# Patient Record
Sex: Male | Born: 1940 | Race: White | Hispanic: No | Marital: Single | Smoking: Never smoker
Health system: Southern US, Community
[De-identification: ages and names within clinical notes are randomized; demographics above are authoritative.]

## PROBLEM LIST (undated history)

## (undated) DIAGNOSIS — Z8489 Family history of other specified conditions: Secondary | ICD-10-CM

## (undated) DIAGNOSIS — E785 Hyperlipidemia, unspecified: Secondary | ICD-10-CM

## (undated) DIAGNOSIS — F419 Anxiety disorder, unspecified: Secondary | ICD-10-CM

## (undated) DIAGNOSIS — T4145XA Adverse effect of unspecified anesthetic, initial encounter: Secondary | ICD-10-CM

## (undated) DIAGNOSIS — G709 Myoneural disorder, unspecified: Secondary | ICD-10-CM

## (undated) DIAGNOSIS — F32A Depression, unspecified: Secondary | ICD-10-CM

## (undated) DIAGNOSIS — H3530 Unspecified macular degeneration: Secondary | ICD-10-CM

## (undated) DIAGNOSIS — Z9889 Other specified postprocedural states: Secondary | ICD-10-CM

## (undated) DIAGNOSIS — M1990 Unspecified osteoarthritis, unspecified site: Secondary | ICD-10-CM

## (undated) DIAGNOSIS — T8859XA Other complications of anesthesia, initial encounter: Secondary | ICD-10-CM

## (undated) DIAGNOSIS — M19011 Primary osteoarthritis, right shoulder: Secondary | ICD-10-CM

## (undated) DIAGNOSIS — R112 Nausea with vomiting, unspecified: Secondary | ICD-10-CM

## (undated) DIAGNOSIS — I10 Essential (primary) hypertension: Secondary | ICD-10-CM

## (undated) DIAGNOSIS — Z9289 Personal history of other medical treatment: Secondary | ICD-10-CM

## (undated) DIAGNOSIS — M19012 Primary osteoarthritis, left shoulder: Secondary | ICD-10-CM

## (undated) DIAGNOSIS — F329 Major depressive disorder, single episode, unspecified: Secondary | ICD-10-CM

## (undated) HISTORY — PX: CHOLECYSTECTOMY: SHX55

## (undated) HISTORY — DX: Hyperlipidemia, unspecified: E78.5

## (undated) HISTORY — PX: JOINT REPLACEMENT: SHX530

## (undated) HISTORY — PX: TONSILLECTOMY: SUR1361

## (undated) HISTORY — PX: SHOULDER ARTHROSCOPY: SHX128

## (undated) HISTORY — PX: KNEE SURGERY: SHX244

---

## 1998-10-31 ENCOUNTER — Ambulatory Visit (HOSPITAL_COMMUNITY): Admission: RE | Admit: 1998-10-31 | Discharge: 1998-10-31 | Payer: Self-pay | Admitting: Family Medicine

## 2000-02-09 ENCOUNTER — Ambulatory Visit (HOSPITAL_COMMUNITY): Admission: RE | Admit: 2000-02-09 | Discharge: 2000-02-09 | Payer: Self-pay | Admitting: Orthopedic Surgery

## 2000-02-09 ENCOUNTER — Encounter: Payer: Self-pay | Admitting: Orthopedic Surgery

## 2000-02-29 ENCOUNTER — Encounter: Payer: Self-pay | Admitting: Orthopedic Surgery

## 2000-03-04 ENCOUNTER — Inpatient Hospital Stay (HOSPITAL_COMMUNITY): Admission: RE | Admit: 2000-03-04 | Discharge: 2000-03-08 | Payer: Self-pay | Admitting: Orthopedic Surgery

## 2004-09-07 IMAGING — CR DG CHEST 2V
2 series · 2 of 2 positions shown · non-contrast
Comparison: none

CLINICAL DATA: Preop herniated disc. 
 CHEST (TWO VIEWS) [DATE] AT [YW] HOURS
 The heart is normal in size.  The aorta is mildly tortuous.  The lungs are under inflated.  Subsegmental atelectasis is present at the left base.  No pneumothoraces or effusions are seen.  Prominent bony density is seen at the costal manubrial junction bilaterally.  Old rib fractures are suspected involving the right fifth and sixth ribs anterolaterally. 
 IMPRESSION
 Subsegmental atelectasis versus scar at the left base.  Otherwise no acute disease.

[view not recorded (1 of 2)]
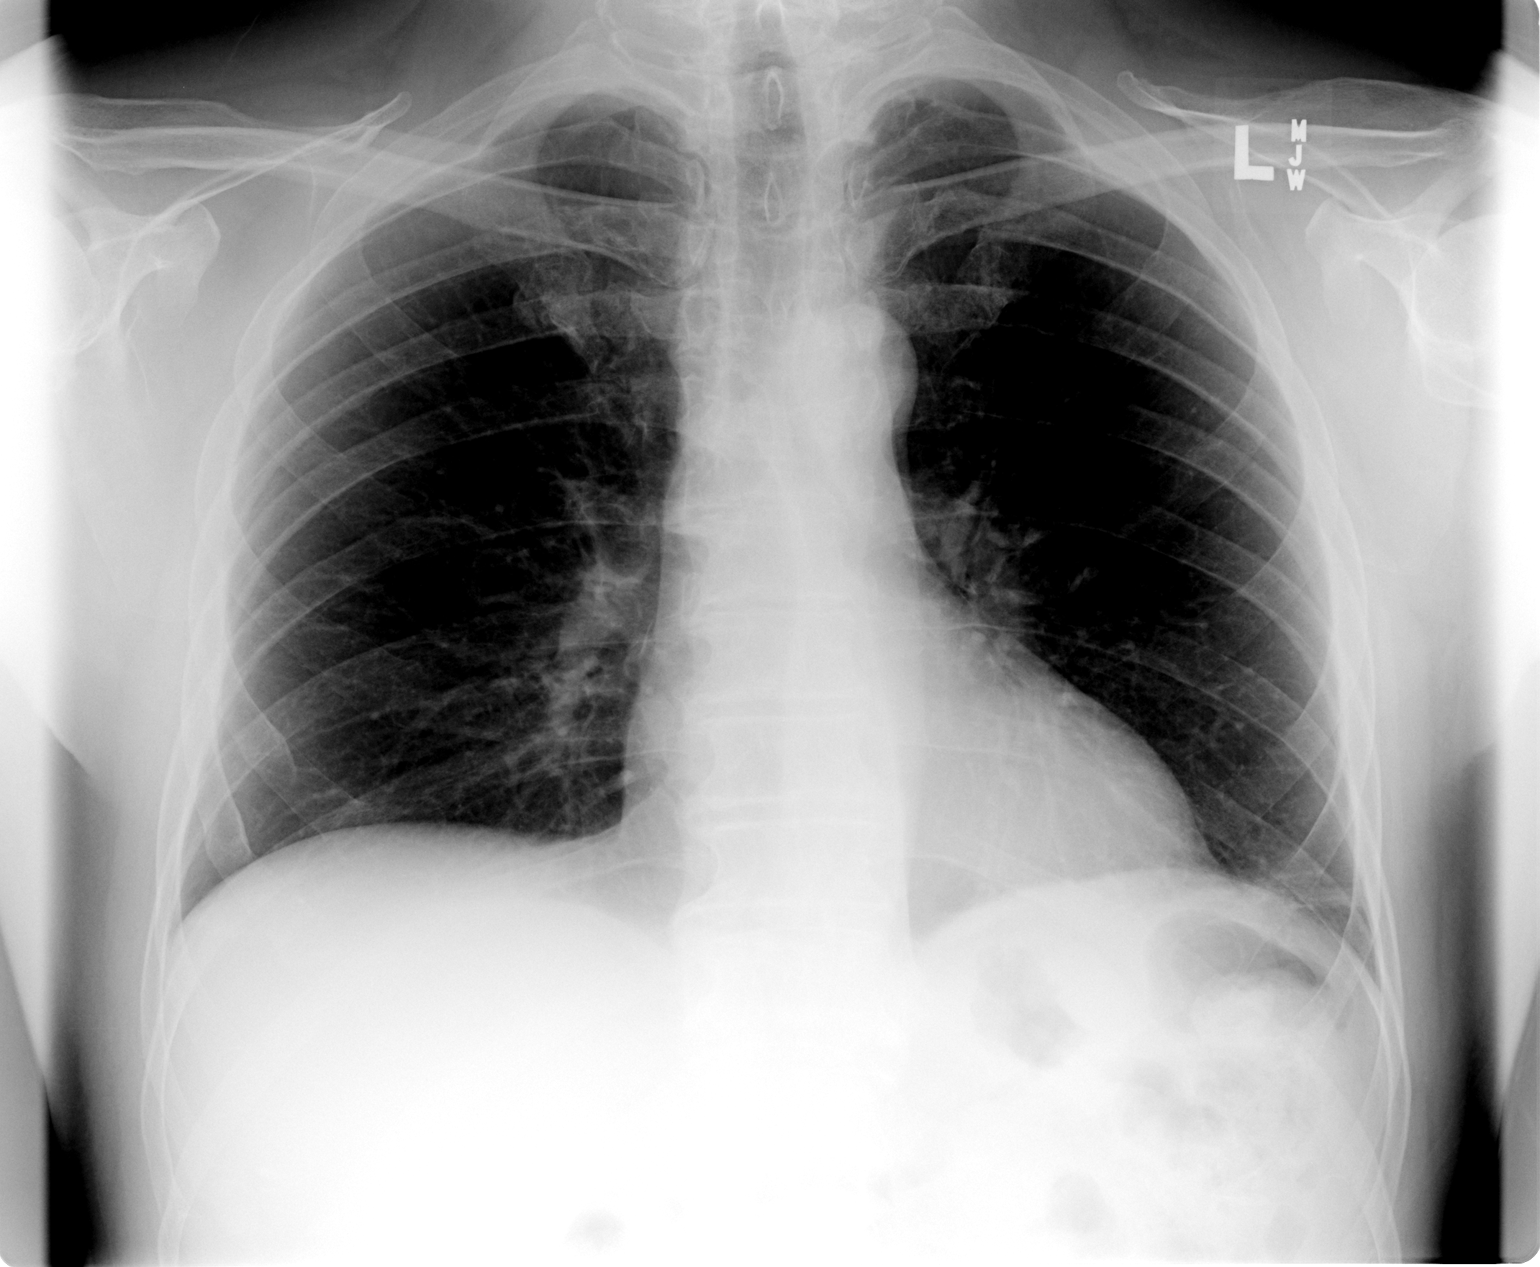

[view not recorded (2 of 2)]
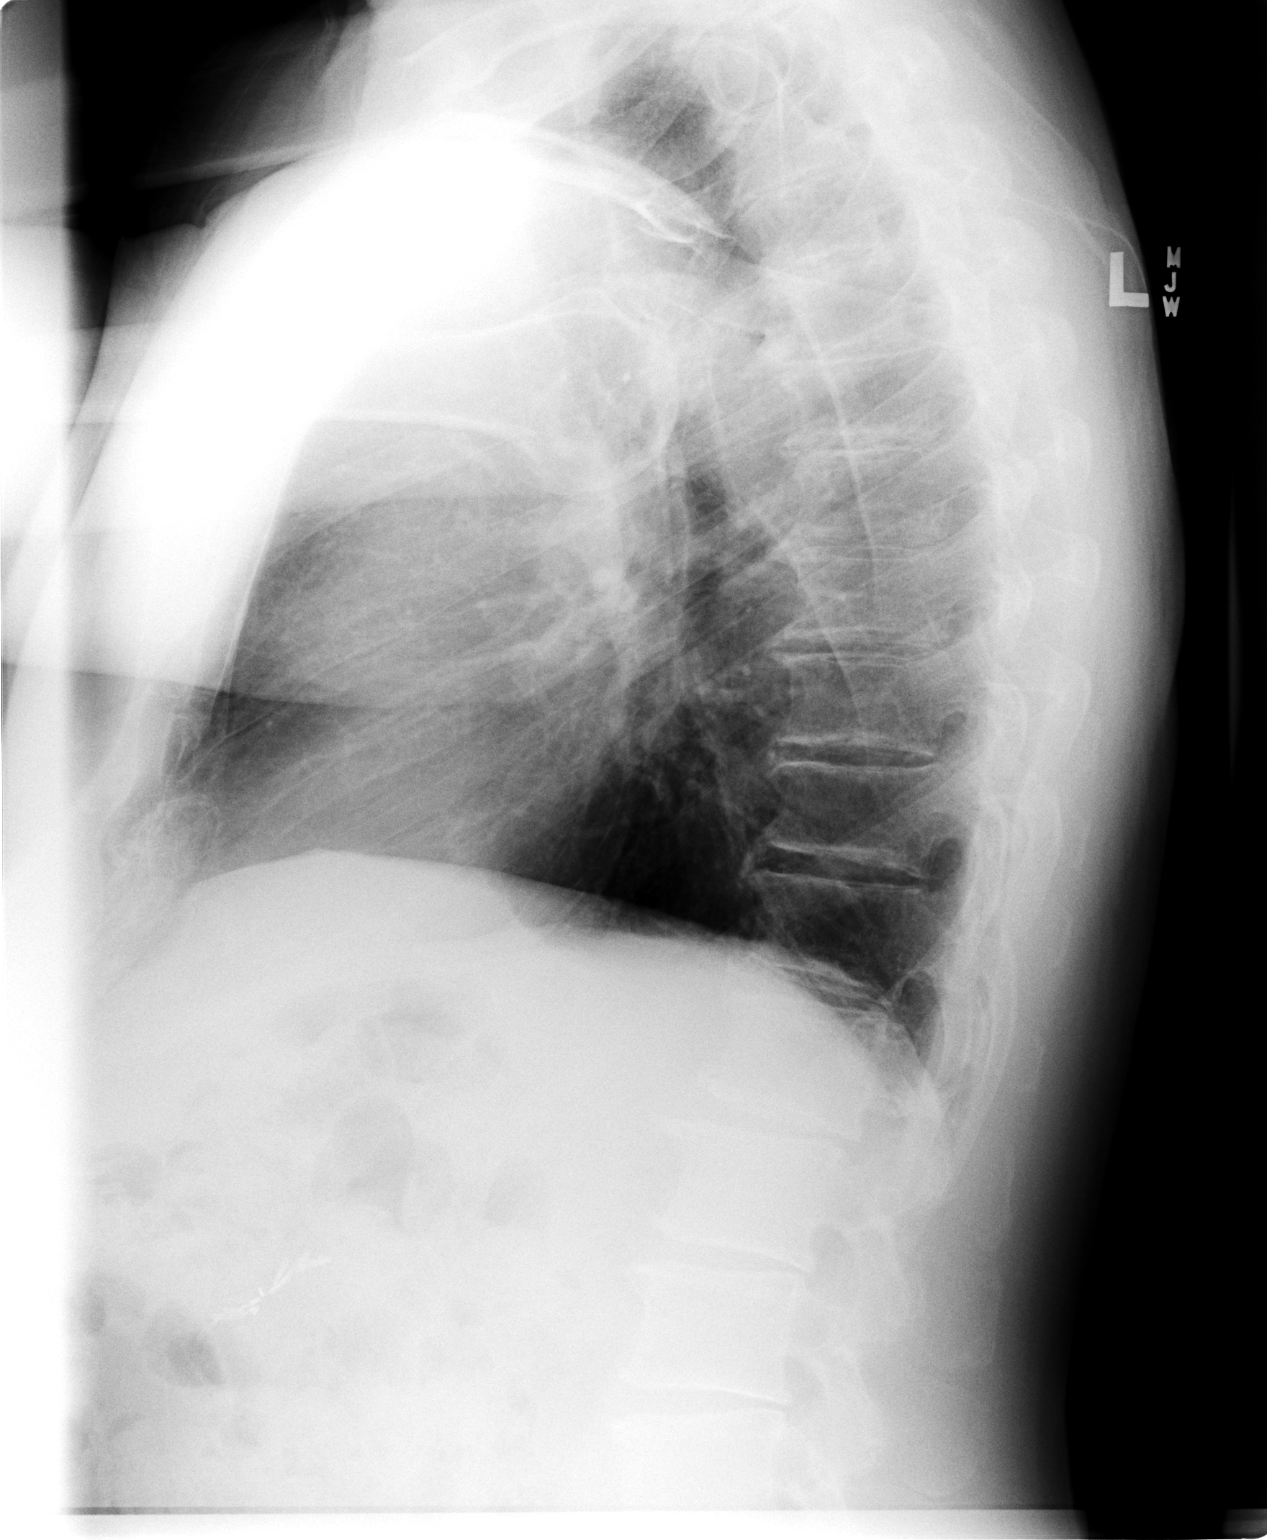

[2 of 2 positions shown; findings below may reference images not displayed]

## 2004-09-12 ENCOUNTER — Inpatient Hospital Stay (HOSPITAL_COMMUNITY): Admission: RE | Admit: 2004-09-12 | Discharge: 2004-09-14 | Payer: Self-pay | Admitting: Neurosurgery

## 2005-08-27 ENCOUNTER — Ambulatory Visit (HOSPITAL_BASED_OUTPATIENT_CLINIC_OR_DEPARTMENT_OTHER): Admission: RE | Admit: 2005-08-27 | Discharge: 2005-08-27 | Payer: Self-pay | Admitting: Orthopedic Surgery

## 2005-08-27 ENCOUNTER — Ambulatory Visit (HOSPITAL_COMMUNITY): Admission: RE | Admit: 2005-08-27 | Discharge: 2005-08-27 | Payer: Self-pay | Admitting: Orthopedic Surgery

## 2006-05-23 ENCOUNTER — Ambulatory Visit: Payer: Self-pay | Admitting: Internal Medicine

## 2006-05-30 ENCOUNTER — Ambulatory Visit: Payer: Self-pay | Admitting: Internal Medicine

## 2006-12-02 HISTORY — PX: CERVICAL FUSION: SHX112

## 2007-05-14 ENCOUNTER — Encounter: Admission: RE | Admit: 2007-05-14 | Discharge: 2007-05-14 | Payer: Self-pay | Admitting: Family Medicine

## 2008-01-05 ENCOUNTER — Inpatient Hospital Stay (HOSPITAL_COMMUNITY): Admission: RE | Admit: 2008-01-05 | Discharge: 2008-01-08 | Payer: Self-pay | Admitting: Orthopedic Surgery

## 2008-01-05 IMAGING — CR DG CHEST 2V
2 series · 2 of 2 positions shown · non-contrast
Comparison: [DATE].

CLINICAL DATA: Preoperative respiratory film ? patient for knee replacement. 
 CHEST - 2 VIEW:

[view not recorded (1 of 2)]
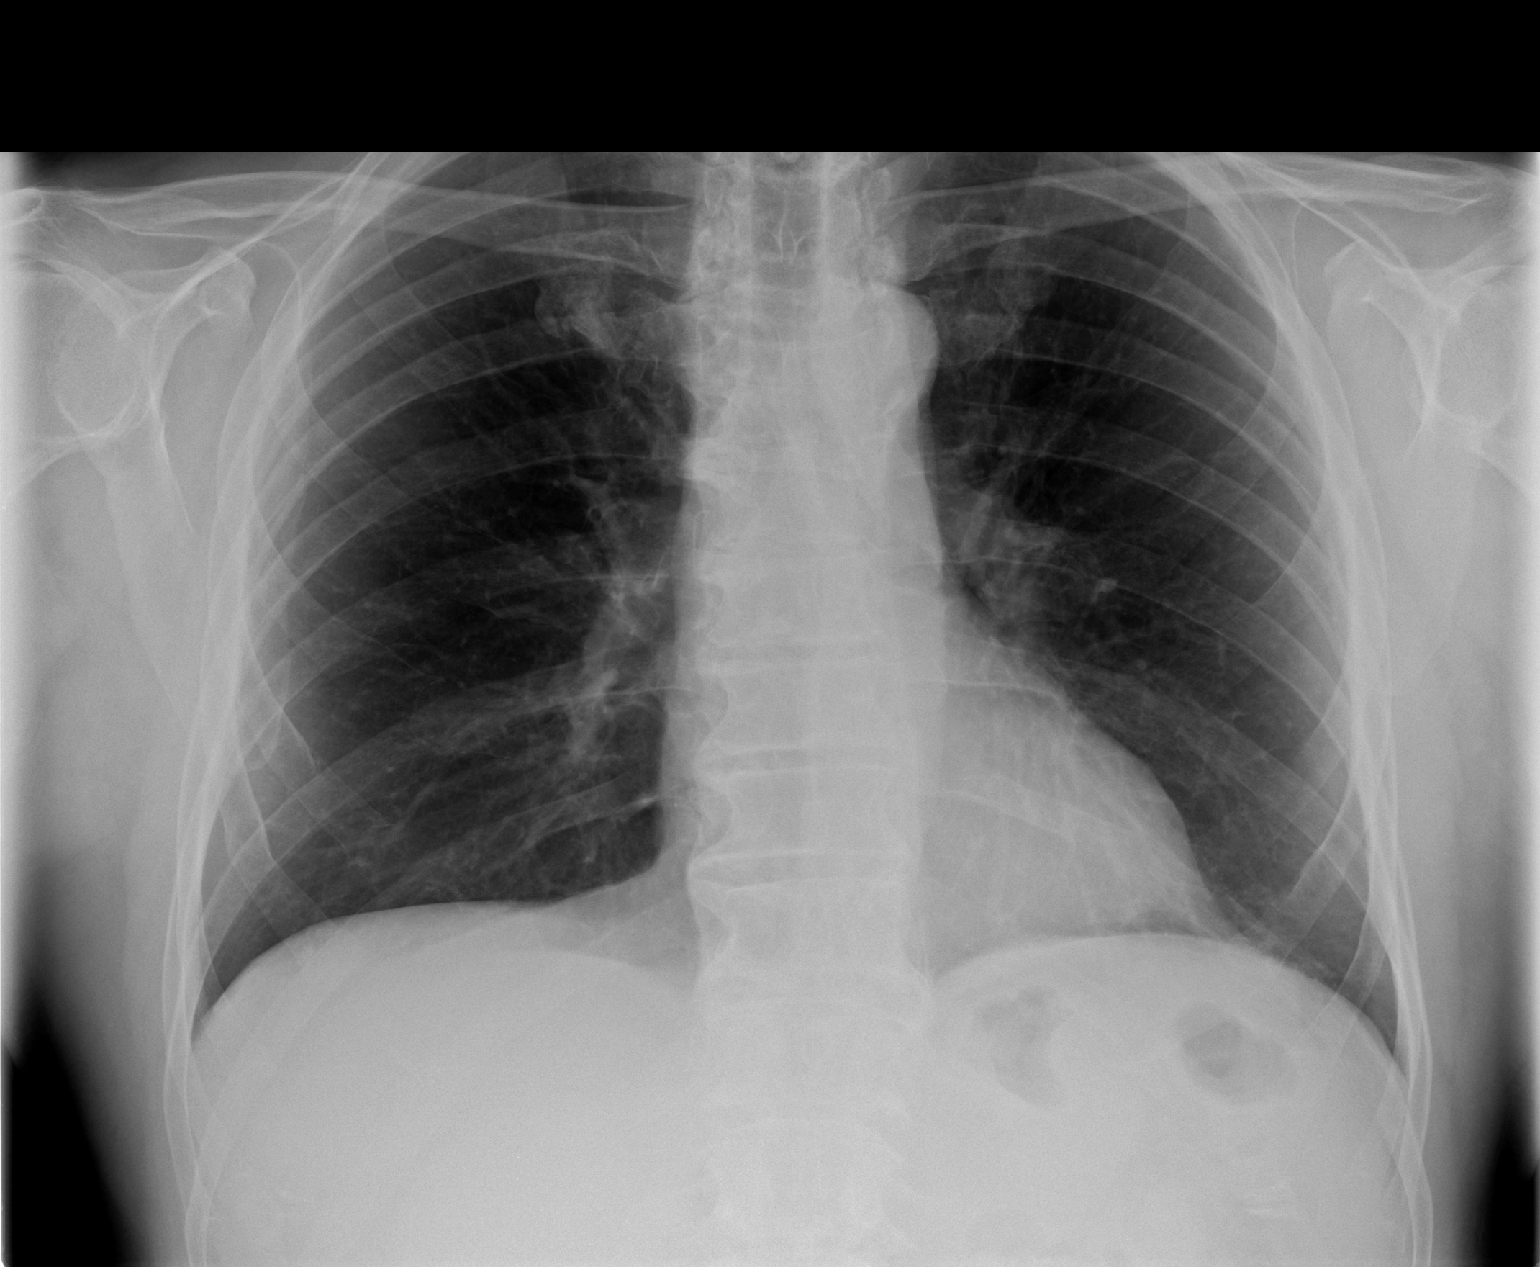

[view not recorded (2 of 2)]
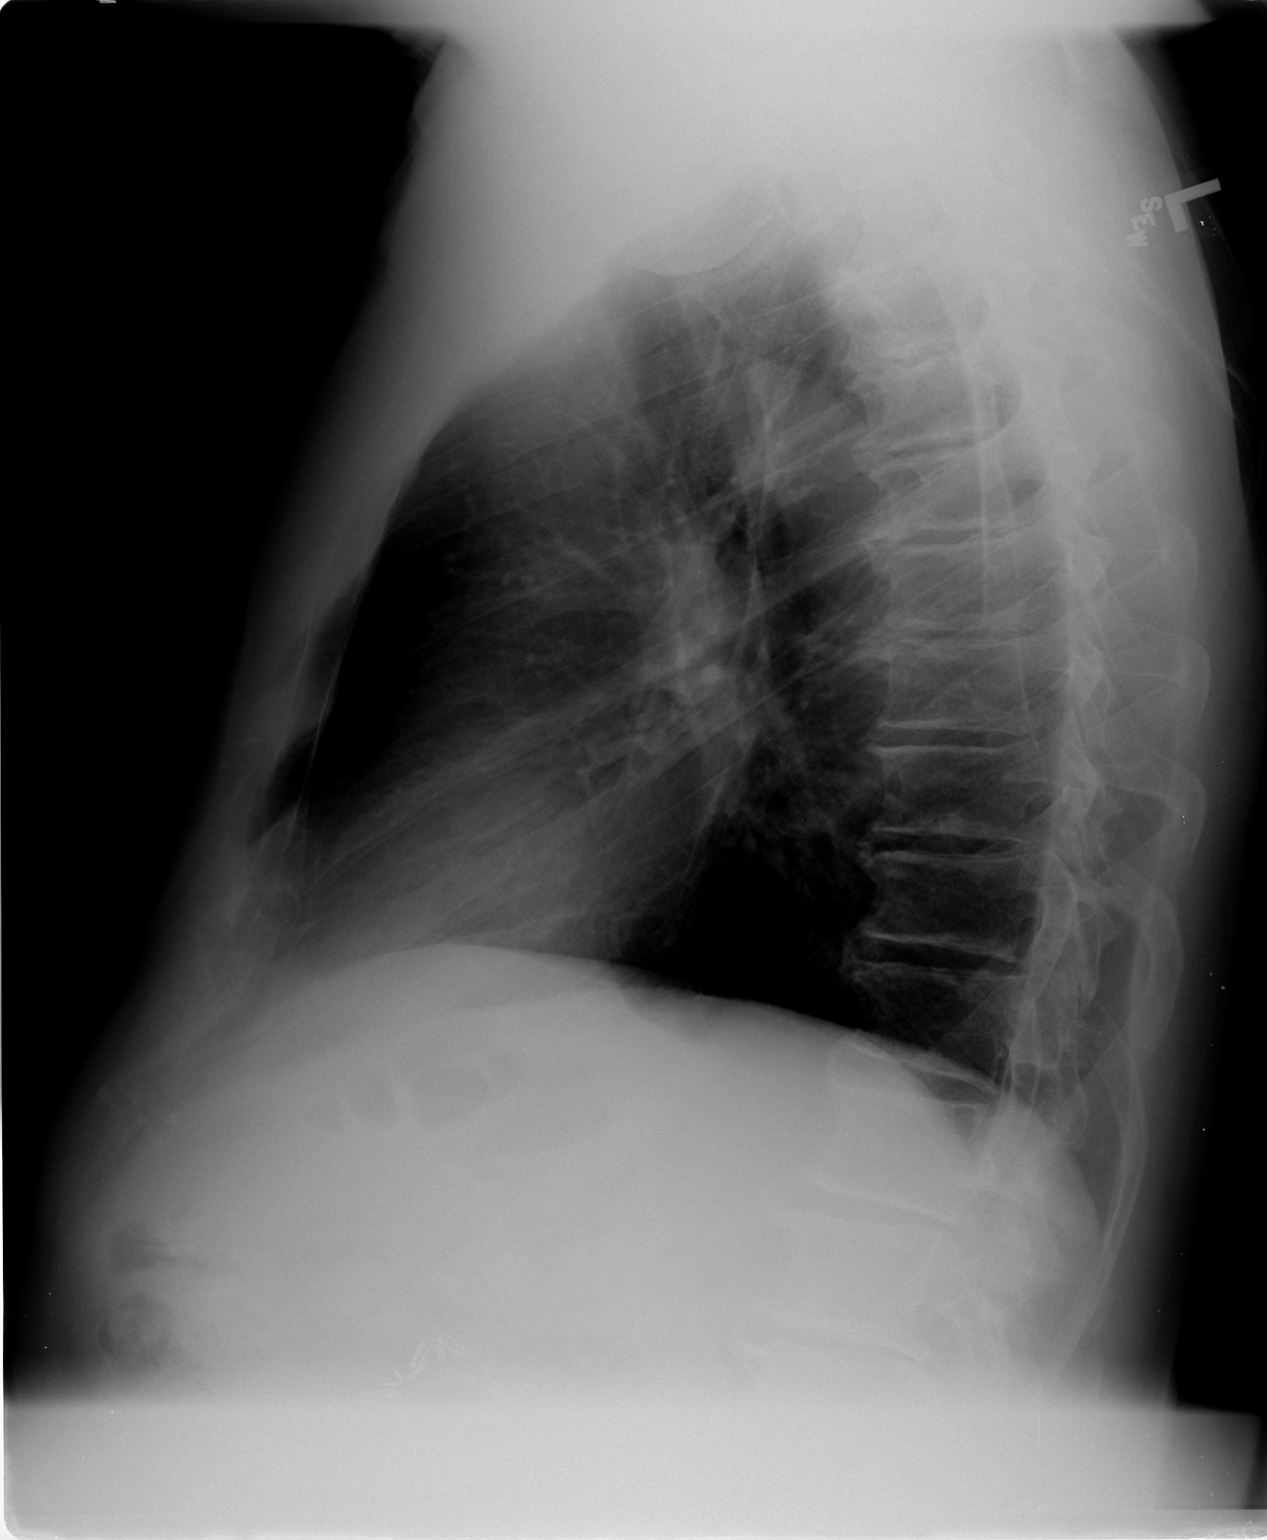

[2 of 2 positions shown; findings below may reference images not displayed]

FINDINGS: Minimal scarring is seen in the left lung base and the lungs are otherwise clear.  The heart size is normal.  No effusion.  Old fractures of the anterior arch of the right 5th and 6th ribs noted.
IMPRESSION: No acute disease.

## 2008-01-06 IMAGING — CR DG CHEST 1V PORT
1 series · 1 of 1 positions shown · non-contrast
Comparison: [DATE]

CLINICAL DATA: Elevated heart rate.
 PORTABLE CHEST ? 1 VIEW:

[AP]
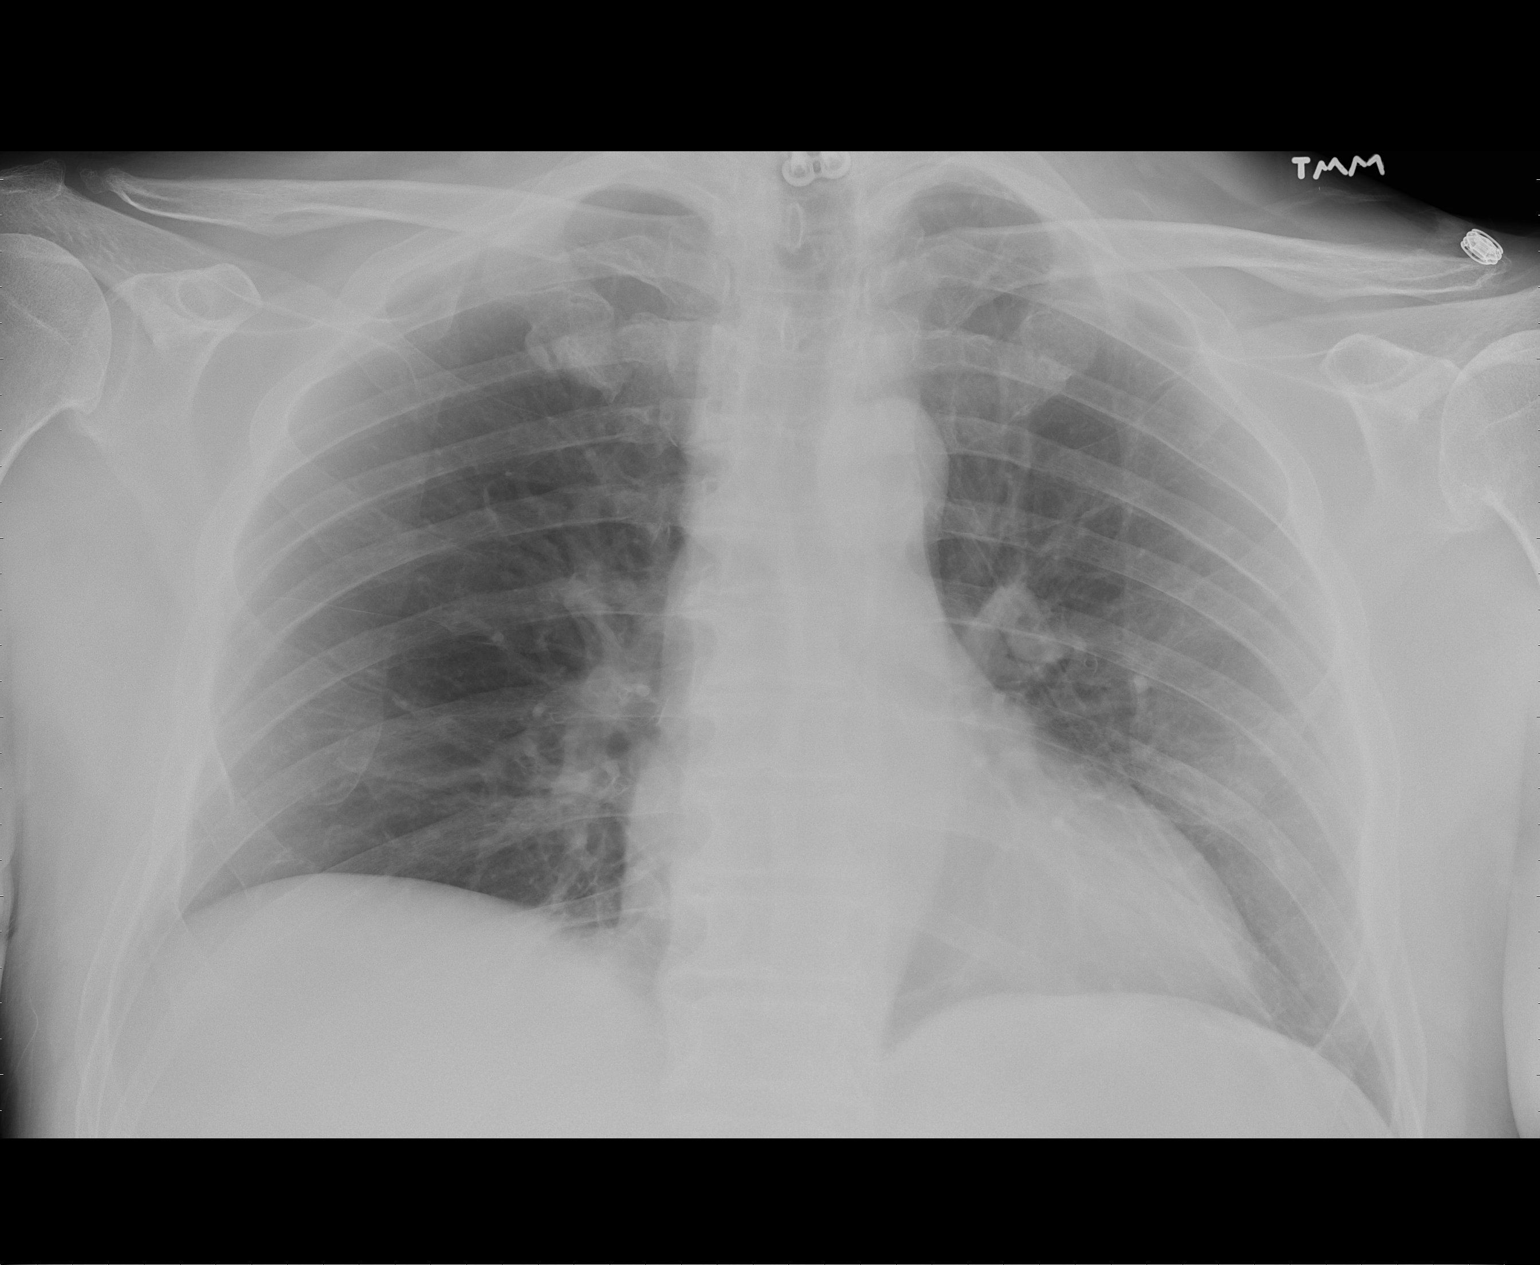

[1 of 1 positions shown; findings below may reference images not displayed]

FINDINGS: Low lung volumes are present.  Accentuated cardiac size.  There are no infiltrates, failure, or pneumothorax.  Previous cervical fusion surgery has been performed.  Degenerative changes are present in the spine.
IMPRESSION: Low lung volumes.  No active disease.

## 2008-01-07 ENCOUNTER — Encounter (INDEPENDENT_AMBULATORY_CARE_PROVIDER_SITE_OTHER): Payer: Self-pay | Admitting: Orthopedic Surgery

## 2008-01-08 IMAGING — CT CT ANGIO CHEST
2 of 5 series · 19 of 36 positions shown · IV contrast (APPLIED)
Comparison: [DATE]

CLINICAL DATA: DVT. Chest tightness. Total knee prosthesis placement.

CT ANGIOGRAPHY OF CHEST - PULMONARY EMBOLISM PROTOCOL
TECHNIQUE: Multidetector CT imaging of the chest was performed according to the
protocol for detection of pulmonary embolism during bolus injection of
intravenous contrast.  Coronal and sagittal plane CT angiographic image
reconstructions were also generated.
Contrast:  80 cc Omnipaque 300

[Series 8: pulm embolism 1.0 b25f thins · axial · 0.69mm/px · z∈[-300,-64]mm · 16 of 264 slices shown]
[im 14/264  lung]
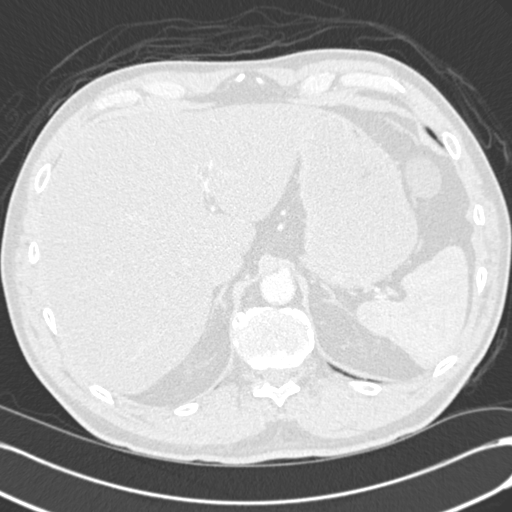
[im 27/264  mediastinal]
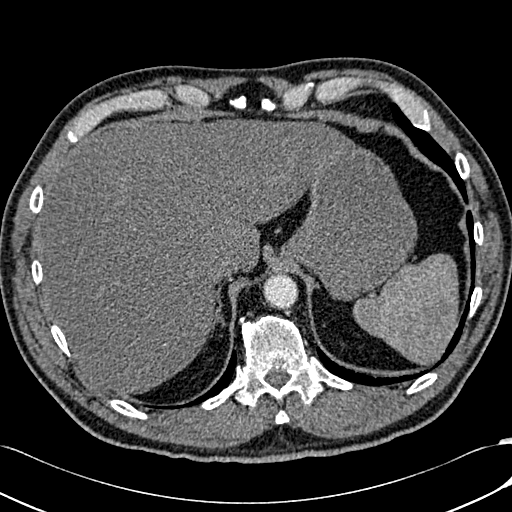
[im 40/264  lung]
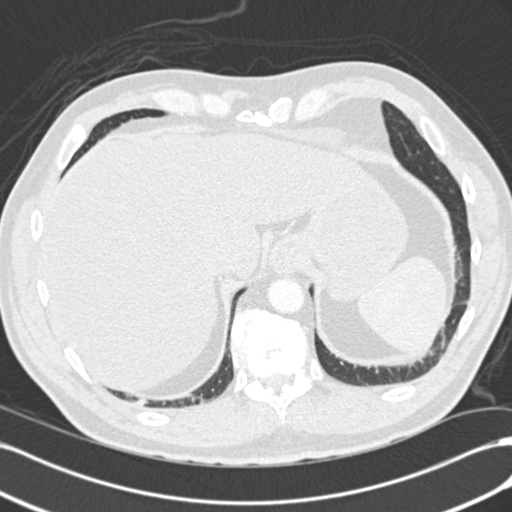
[im 66/264  mediastinal]
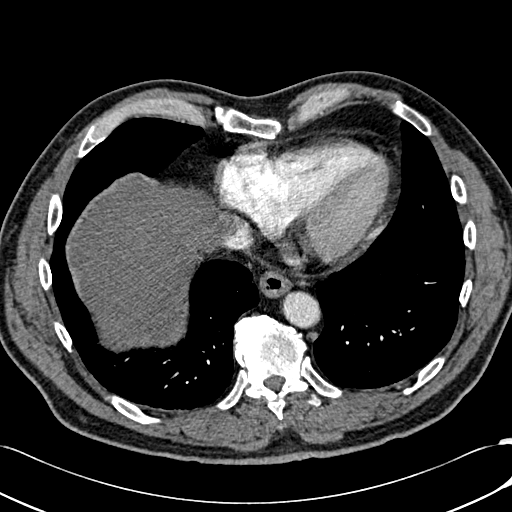
[im 79/264  lung]
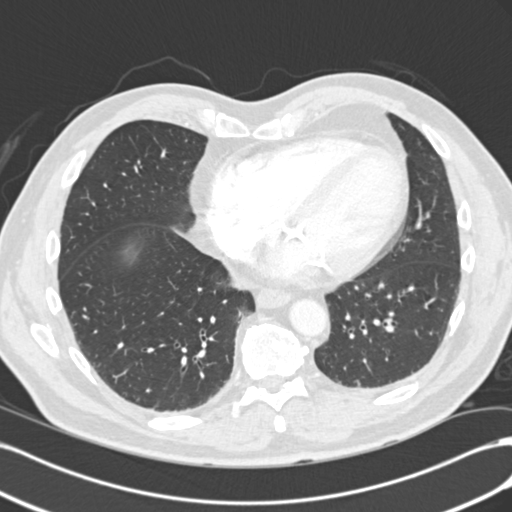
[im 93/264  mediastinal]
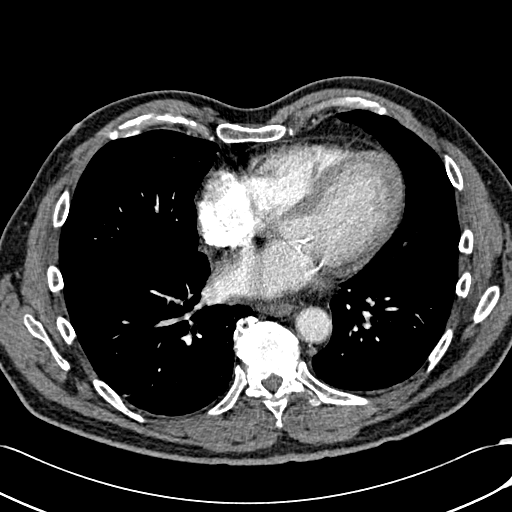
[im 106/264  lung]
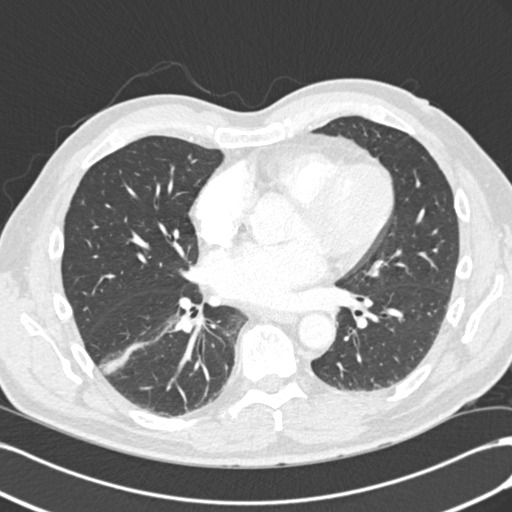
[im 119/264  mediastinal]
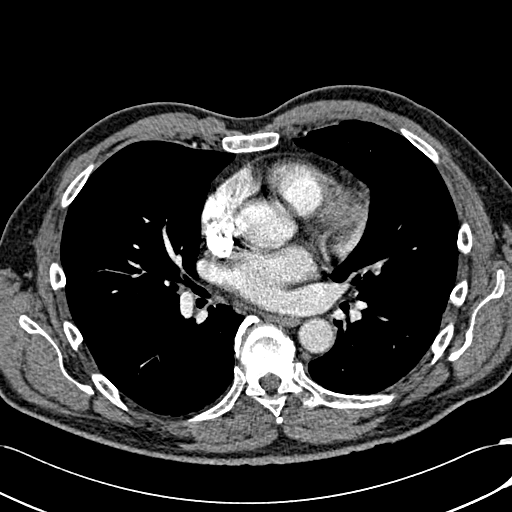
[im 145/264  lung]
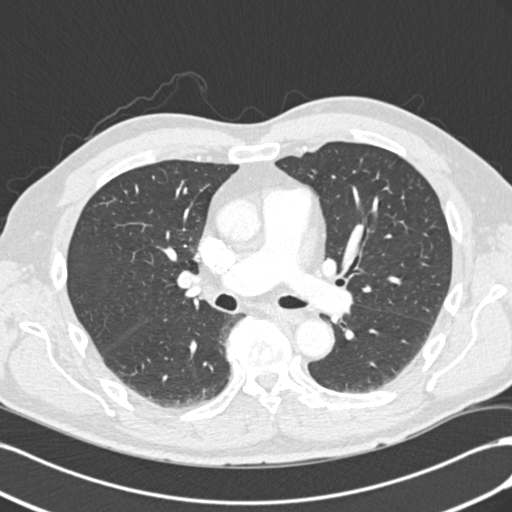
[im 158/264  mediastinal]
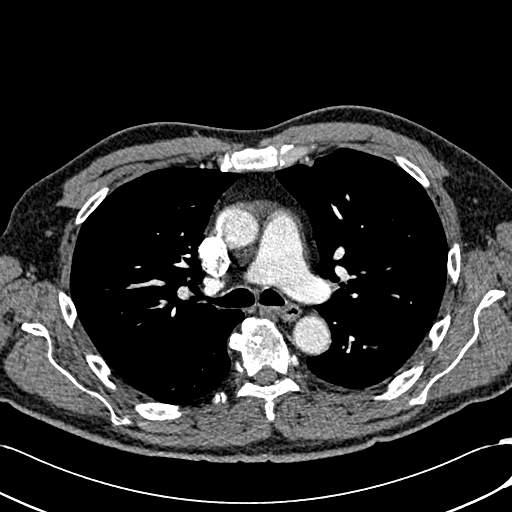
[im 171/264  lung]
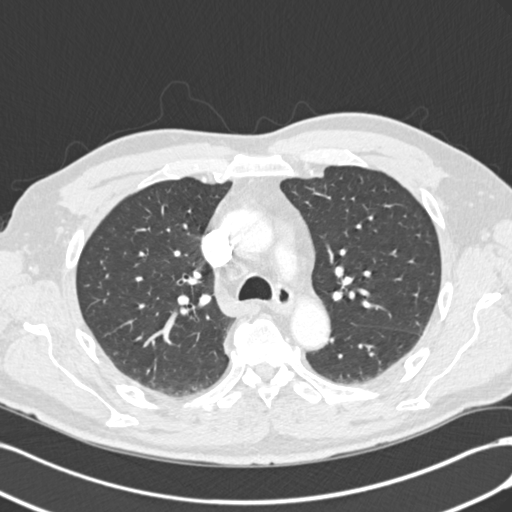
[im 185/264  mediastinal]
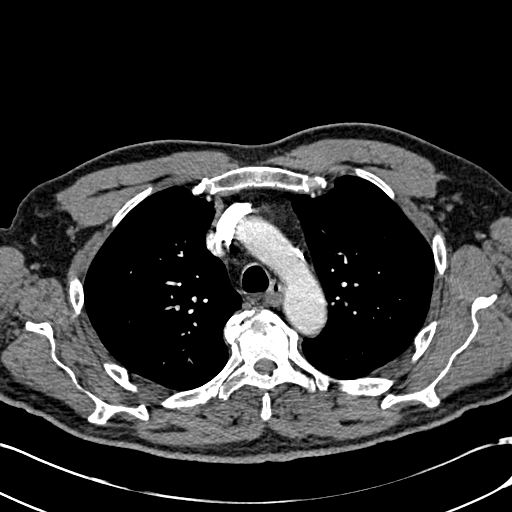
[im 198/264  lung]
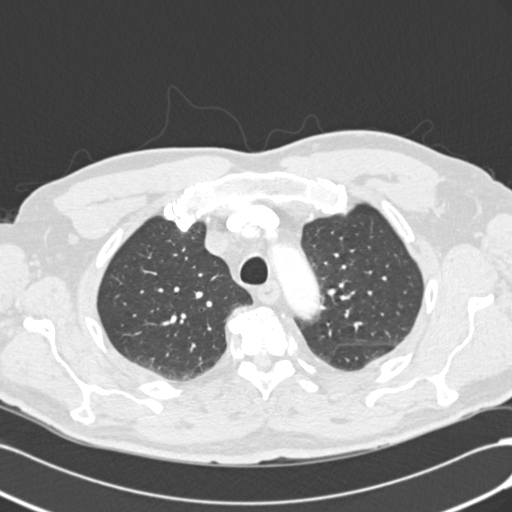
[im 224/264  mediastinal]
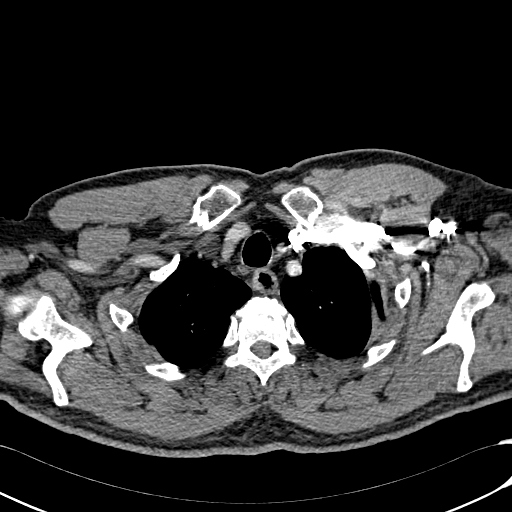
[im 237/264  lung]
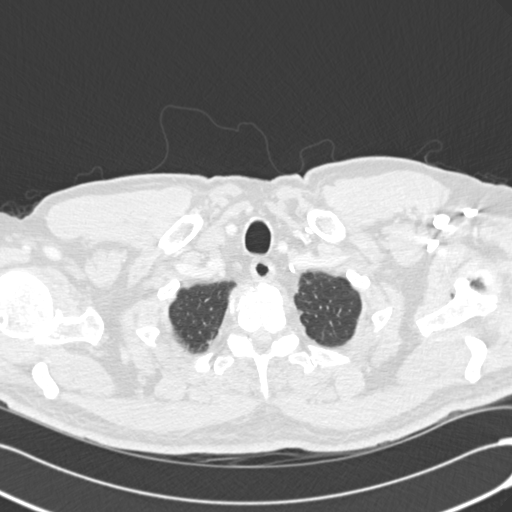
[im 250/264  mediastinal]
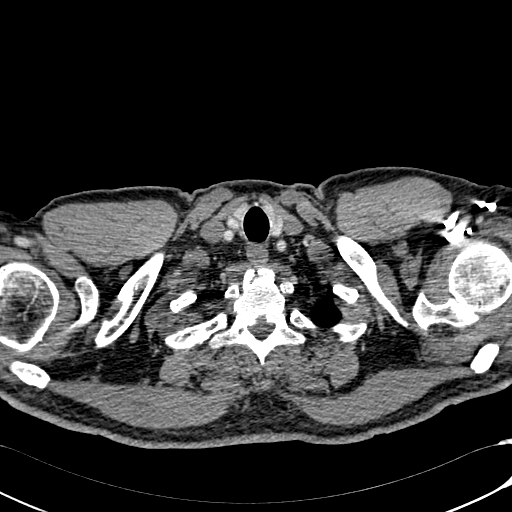

[Series 9: pulm embolism 2.0 spo st · coronal · 0.69mm/px · 3 of 114 slices shown]
[im 23/114  mediastinal]
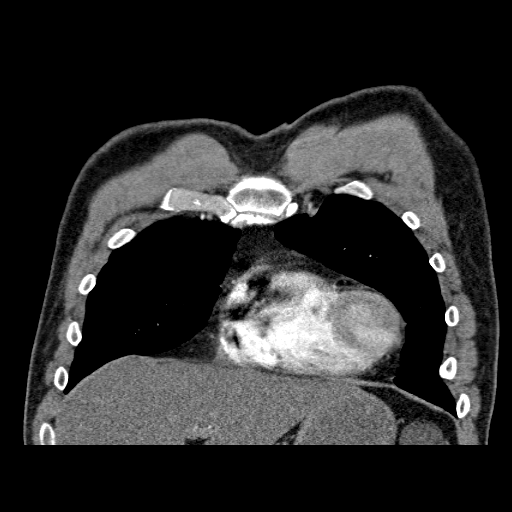
[im 46/114  mediastinal]
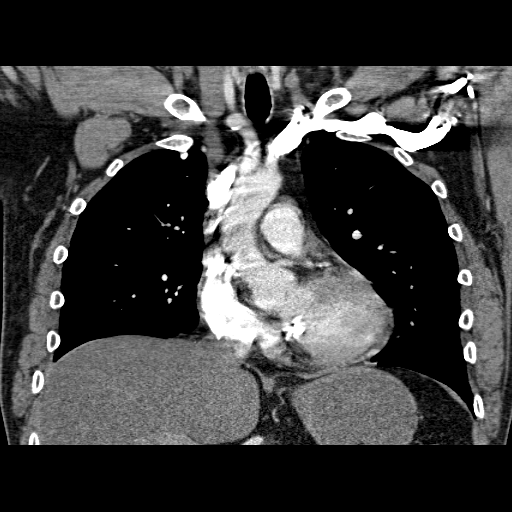
[im 68/114  mediastinal]
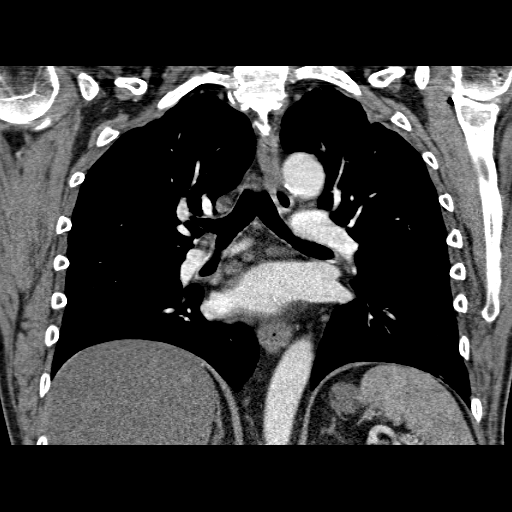

[19 of 36 positions shown; findings below may reference images not displayed]

FINDINGS: No filling defect is identified in the pulmonary arterial tree to
suggest pulmonary embolus. Aortic arch atherosclerotic calcification is present.
No dissection is noted.

No pathologic mediastinal or hilar adenopathy is noted. There is diffuse fatty
infiltration of the liver. Calcification of the mitral valve is noted.

Linear subsegmental atelectasis is present in the right lower lobe. Thoracic
spondylosis is present.

IMPRESSION

1. No embolus is identified.
2. Mitral calcification noted.
3. Diffuse fatty infiltration of the liver.
4. Right lower lobe subsegmental atelectasis.
5. Thoracic spondylosis.

## 2011-04-16 NOTE — Op Note (Signed)
NAME:  Mitchell Harris, Mitchell Harris NO.:  [id number]   MEDICAL RECORD NO.:  [id number]          PATIENT TYPE:  INP   LOCATION:  2855                         FACILITY:  [facility]   PHYSICIAN:  Rodney A. Mortenson, M.D.DATE OF BIRTH:  December 07, 1940   DATE OF PROCEDURE:  01/05/2008  DATE OF DISCHARGE:                               OPERATIVE REPORT   PREOPERATIVE DIAGNOSIS:  End-stage osteoarthritis right knee.   POSTOPERATIVE DIAGNOSIS:  End-stage osteoarthritis right knee.   OPERATION:  Right total knee replacement, computer assist, using DePuy  large right cemented femoral component with an NBT tibial tray size 4,  large cemented metal back patella, and a 10 mm poly insert.   SURGEON:  Lenard Galloway. Chaney Malling, M.D.   ASSISTANT:  Arlys John D. Petrarca, P.A.-C.   ANESTHESIA:  General.   DESCRIPTION OF PROCEDURE:  The patient placed on the operating table in  the supine position and a pneumatic tourniquet wrapped about the right  upper thigh.  The entire right lower extremity was prepped with  DuraPrep, and draped out in the usual manner.  A vidrape was placed over  the operative site.  The leg was wrapped out with an Esmarch, tourniquet  was elevated.  An incision was made starting above the patella and  carried down to the tibial tubercle.  Skin edges were retracted.  Bleeders coagulated.  A long medial parapatellar incision was made, and  the patella was everted.  The bone spurs on either side of the femoral  condyles and tibia were removed with a rongeur.  There was significant  varus deformity, and soft tissue release was done on the medial side,  quite extensively.  This gave excellent access to the joint itself.   Both medial and lateral meniscus were then totally excised.  Cruciate  ligaments were removed.  Excellent access was achieved.  At this point,  Schanz pins were placed in the distal femoral proximal tibia in the  standard manner.  Note, tibial and femoral arrays were put  in place.   Initially we did registration defining the mechanical axis of the  femoral head center, the tibial mechanical axis. defined the proximal  tibial plateau, and defined the distal femoral epicondyles.  Once this  was completed following computer prompting, this was registered in the  computer, and planning was done.   Tibial resection settings were registered.  Tibial resection was then  done following the computer promptings in the standard manner.  Once  this was completed, a soft tissue balancing was done with attention to  both the flexion extension.  An additional soft tissue release in the  medial side was accomplished.  The tension in both flexion and standard  post balance went extremely well once this was accomplished.   Next the femoral implant following of the planning registration  guidelines was done.  First the anterior distal femur was resected using  computer promptings.  The second femoral resection guide was placed with  the anterior cut surface of the femur, and this was fixed in place with  fixation pins.  Using computer promptings, the  distal femoral cut was  achieved.  At this point, a spacer block was inserted in both  flexion/extension and there was excellent balancing of the collateral  ligaments both in flexion and extension.  The final finishing femoral  guide was then placed over the distal end of the femur, locked in  position with the fixation pins.  Drill holes were placed in the distal  femur, and the chamfer cuts were made, and a notch cut was made.   Once this was completed, attention turned to the proximal tibia.  The  tibia was subluxed anteriorly using double spike retractors.  The size  #4 trial tibial tray was put in place, locked into position with  fixation pins.  The tibial tower was smoke stack was inserted, and a  drill hole was placed in the proximal tibia.  The tower was removed and  the winged keel was driven down into the tibia  and locking in this  position.  The fixation pins were then removed.   A 10 mm trial poly was inserted on the proximal tibia, and the femoral  components articulated over the distal end of the femur.  The knee was  then articulated and put through a full range of motion.  There was  wonderful flexion and extension, extra stability in flexion/extension to  varus and valgus stress.  This tracked very nicely, and there was no  spitting of the poly.  I was very pleased with complete balancing of all  components.   Attention was now turned to the posterior aspect of the patella.  The  patella cutting guide was placed in the posterior aspect of the patella  after it was measured.  The posterior aspect of the patella was  amputated, and then drill holes were placed in the cut surface of the  patella using the drill guide.  A trial patella was then inserted and  the knee was articulated, again, and put through a full range of motion.  AP drawer was negative.  There was excellent range of motion.  Excellent  tracking of the patella without any releases.  All of the components  were then removed, and a pulsating lavage was used to clean out all the  debris.  Once this was accomplished to our satisfaction, the final  components were placed on the back table.  Glue was mixed, and glue was  placed on each of the components to be inserted;  and on the cut surface  the tibia in the femur, and posterior aspect of the patella.  The tibial  component was inserted first and driven down.  Excess glue was removed  in a standard manner.  The trial poly was inserted and glue was placed  over the distal end of femur, and the femoral components driven home,  and impacted very tightly, and excess glue was removed.   The leg was held in extension, and the patellar component was inserted  on the posterior aspect of the patella, and held in place with a patella  clamp.  Once the glue had cured and was hardened,  the patella clamp was  removed.  The knee was flexed, and the tibial trial was removed.  A  small osteotome was used to remove any excess glue, and a great deal of  time was spent cleaning this up.  Once this was accomplished, the  tourniquet was dropped and the debris was removed.  Bleeders were  coagulated.  A fairly dry field was  achieved.  No pumpers were seen,  once this was finished.   The final tibial component was then snap-fitted in place.  The knee  articulated, once again, and tracked very nicely.  The long medial  parapatellar incision was closed with interrupted heavy Tycron sutures.  Vicryl was used to close the subcutaneous tissue, and stainless steel  staples used to close the skin.  Sterile dressings were applied, and the  patient returned to the recovery room in excellent condition.  Technically, I was extremely pleased with the entire construct and  reconstruction.   DRAINS:  None.   COMPLICATIONS:  None.     Rodney A. Chaney Malling, M.D.  Electronically Signed    RAM/MEDQ  D:  01/05/2008  T:  01/05/2008  Job:  [id number]

## 2011-04-16 NOTE — Consult Note (Signed)
NAME:  NADIM, MALIA NO.:  [id number]   MEDICAL RECORD NO.:  [id number]          PATIENT TYPE:  INP   LOCATION:  5037                         FACILITY:  [facility]   PHYSICIAN:  Marcellus Scott, MD     DATE OF BIRTH:  Mar 24, 1941   DATE OF CONSULTATION:  01/06/2008  DATE OF DISCHARGE:                                 CONSULTATION   PRIMARY MEDICAL DOCTOR:  Quita Skye. Artis Flock, M.D.   REQUESTING PHYSICIAN:  Lenard Galloway. Chaney Malling, M.D.   REASON FOR CONSULTATION:  Fever and tachycardia.   CHIEF COMPLAINT:  Right lower extremity pain.   HISTORY OF PRESENT ILLNESS:  Mr. Vandervelden is a pleasant 70 year old  Caucasian male patient with history of hyperlipidemia and  gastroesophageal reflux disease who had elective right total knee  replacement on January 05, 2008. Today, patient is postoperative day #1.  At approximately 12:30 p.m., while patient was on CPM, the patient's  heart rate was found to be 156 beats per minute on the pulse oximetry.  However, the patient had no cardiorespiratory signs or symptoms. His EKG  was found to have HR of 106 beats per minute. Consult was subsequently  called for this tachycardia, and temperature spike of 101.3 degrees  Fahrenheit at 6 a.m. today.   The patient indicates that he had excruciating right lower extremity  pain this morning which was 10/10. However, since then, he has gotten  pain medications, and his pain is down to 5/10 and is bearable. Apart  from that, the patient denies any other complaints. Specifically, there  is no headache, earache or sore throat. No history of cough, chest pain,  dyspnea, palpitations, nausea, vomiting, abdominal pain. He is  tolerating his diet. He has not had a BM postoperatively. He has not  passed flatus. There is no history of dysuria.   PAST MEDICAL HISTORY:  1. Hyperlipidemia.  2. Gastroesophageal reflux disease.  3. Colonoscopy a year ago, said to be negative.  4. EGD done 5 years ago with  gastroesophageal reflux disease.   PAST SURGICAL HISTORY:  1. Neck surgery - fusion of cervical vertebrae.  2. Left total knee replacement 7 to 8 years ago.  3. Bilateral shoulder surgery.  4. Cholecystectomy.   ALLERGIES:  CODEINE WHICH CAUSES FACE SWELLING.   HOME MEDICATIONS:  1. TriCor 145 mg p.o. daily.  2. Nexium 40 mg p.o. every night.  3. Lipitor 20 mg p.o. daily.  4. Meloxicam 5 mg p.o. daily.  5. Multivitamin 1 p.o. daily.  6. Calcium supplements.  7. Fiber supplements.   FAMILY HISTORY:  1. The patient's father died at the age of 59 from complications of      multiple cerebral vascular accidents. He had started having strokes      in the early 60s. He also had colon cancer.  2. Mother died at age 44 years from pulmonary embolism. She was status      post hip replacement.  3. The patient's brother died at age 43 years. He had a pacemaker.  4. The patient's next brother with history of porcine valve  replacement. He also had knee replaced. Both these surgeries in his      56s.   SOCIAL HISTORY:  The patient is married, and his spouse is at the  bedside. He is a retired Press photographer for [employer]. There  is no history of smoking, alcohol or drug abuse.   REVIEW OF SYSTEMS:  Fourteen systems reviewed and apart from the history  of present illness is noncontributory. The patient indicates that he is  usually constipated and does not have a BM sometimes two to three days  at a time.   PHYSICAL EXAMINATION:  Mr. Chahal is a moderately built and nourished  male patient in no obvious distress.  VITAL SIGNS:  Temperature 98.7 degrees Fahrenheit, pulse 100 per minute  and regular, respirations 20 per minute, blood pressure 143/76 mmHg,  oxygen saturation 96% on 2 liters per minute nasal cannula oxygen.  HEENT:  Is nontraumatic, normocephalic. Pupils are equal, round, and  reactive to light and accommodation. Bilateral immature cataracts. Oral  cavity with  slightly dry mucosa but no oropharyngeal erythema or thrush.  NECK:  Supple. No JVD or carotid bruit.  LYMPHATICS:  No lymphadenopathy.  RESPIRATORY SYSTEM:  Clear to auscultation.  CARDIOVASCULAR SYSTEM:  First and second heart sounds heard. No third or  fourth heart sounds or murmurs.  ABDOMEN:  Nondistended, nontender. No organomegaly or masses  appreciated. Bowel sounds are normally heard.  CENTRAL NERVOUS SYSTEM:  The patient is awake, alert, oriented x3. No  focal neurological deficits.  EXTREMITIES:  With compression stocking in the left lower extremity. No  clubbing, cyanosis, or edema. Peripheral pulses are symmetrically felt.  Right lower extremity in splint but no evidence of bleeding, discharge.  Patient able to move both lower extremities.  SKIN:  Is without any rashes.  MUSCULOSKELETAL:  Is noncontributory.   LABORATORY DATA:  With basic metabolic panel with sodium 133, potassium  3.6, chloride 101, bicarb 26, glucose 125, BUN 19, creatinine 1.23,  calcium 8.8. CBC with hemoglobin 11, hematocrit 32, white blood cells  13.1, platelets 264. Hepatic panel done preoperatively was normal.  Urinalysis done preoperatively was also negative. Chest x-ray is low  lung volume but otherwise negative. This has been reviewed by me.   EKG is sinus tachycardia at 105 beats per minute, normal axis, no acute  changes. Some nonspecific ST-T wave changes. Preoperative EKG which was  normal sinus rhythm, normal EKG.   ASSESSMENT AND PLAN:  1. Febrile illness, sinus tachycardia, mild leukocytosis, most likely      secondary to a postoperative reaction, pain and dehydration. There      is no clinical focus of sepsis at this time. The patient has been      on empiric cephazolin until this a.m. Will obtain blood cultures      and urinalysis. To aim for adequate pain control and antipyretics      agents.  Patient is also using his incentive spirometry well, to      continue that. There is no  antibiotic therapy indicated at this      time.  2. Mild dehydration. Will briefly increase patient's IV fluid      hydration and encourage to take increase p.o liquids.  This might      also be contributing to patient's tachycardia.  3. Hyperlipidemia. Home medications.  4. Gastroesophageal reflux disease. The patient is on protein-pump      inhibitors.  5. Acute blood loss anemia. To follow up CBC in  the a.m.   Thank you for this consult, and we will follow along with you.      Marcellus Scott, MD  Electronically Signed     AH/MEDQ  D:  01/06/2008  T:  01/06/2008  Job:  [id number]   cc:   Quita Skye. Artis Flock, M.D.  Rodney A. Chaney Malling, M.D.

## 2011-04-19 NOTE — Op Note (Signed)
NAME:  JSHAWN, HURTA.:  [id number]   MEDICAL RECORD NO.:  [id number]          PATIENT TYPE:  INP   LOCATION:  2899                         FACILITY:  [facility]   PHYSICIAN:  Hewitt Shorts, M.D.DATE OF BIRTH:  1941-03-22   DATE OF PROCEDURE:  09/12/2004  DATE OF DISCHARGE:                                 OPERATIVE REPORT   PREOPERATIVE DIAGNOSIS:  Cervical stenosis, cervical spondylosis, cervical  degenerative disk disease, and cervical myeloradiculopathy.   POSTOPERATIVE DIAGNOSIS:  Cervical stenosis, cervical spondylosis, cervical  degenerative disk disease, and cervical myeloradiculopathy.   OPERATION PERFORMED:  C3-4, C4-5, C5-6 and C6-7 anterior cervical diskectomy  and arthrodesis with iliac crest allograft and tether cervical plating.   SURGEON:  Hewitt Shorts, M.D.   ASSISTANT:  Payton Doughty, M.D.   ANESTHESIA:  General endotracheal.   INDICATIONS FOR PROCEDURE:  The patient is a 70 year old man who presented  with atrophy and weakness of the proximal right upper extremity including  the parascapular deltoid and biceps muscles.  MRI revealed advanced  degenerative disk disease, spondylosis with resulting stenosis and cord  compression.  Decision was made to proceed with multilevel decompression and  arthrodesis.   DESCRIPTION OF PROCEDURE:  The patient was brought to the operating room and  placed under general endotracheal anesthesia.  The patient was placed in 10  pounds of halter traction.  The neck was prepped with Betadine soap and  solution and draped in sterile fashion.  An oblique left anterior cervical  incision was made paralleling the anterior border of the left  sternocleidomastoid.  The line of the incision was infiltrated with local  anesthetic and epinephrine.  Dissection was carried down through the  subcutaneous tissue and platysma.  Bipolar cautery and electrocautery were  used to maintain hemostasis.  Dissection was  carried out to an avascular  plane, leaving the sternocleidomastoid, carotid artery and jugular vein  laterally and trachea and esophagus medially.  The ventral aspect of the  vertebral column identified and a localizing x-ray was taken and the ventral  aspect of the ventral column was identified.  X-ray was taken and we  identified the C3-4, C4-5, C5-6 and C6-7 intervertebral disk spaces.  Diskectomy was performed at each of the four levels.  In each case, the  annulus was incised.  Anterior osteophytic overgrowth was removed.  The disk  space entered and diskectomy performed microcurettes and pituitary rongeurs.  The cartilaginous end plates of the corresponding vertebrae were removed  using microcurettes along with the X-Max drill.  The operating microscope  was draped and brought into the field to provide additional magnification,  illumination and visualization and the remainder of the decompression was  performed using microdissection and microsurgical technique. Posterior  osteophytic overgrowth and spondylitic disk herniation was removed at each  level using the X-Max drill along with 2 mm Kerrison punch with a thin foot  plate.  There was significant spondylitic overgrowth which was carefully  removed and we were able to decompress the spinal canal and the neural  foramina at each of the four levels.  Once the decompression was completed  at each of these levels, hemostasis was established with the use of Gelfoam  soaked in Thrombin.  We then selected tricortical iliac crest allografts for  each level.  Each level was sized with a bone sizer.  We selected 6 mm in  height grafts for each level.  They were all hydrated in saline solution and  placed in the intervertebral disk space and countersunk.  We then further  prepared the anterior aspect of the vertebral column removing any remaining  osteophytic overgrowth.  We selected an 80 mm tether cervical plate and it  was positioned  over the fusion construct and secured to the C3 and C7  vertebrae with a pair of 4.0 x 15 mm variable angle screws.  We placed  single screws at C4, C5 and C6.  Each screw hole was drilled and tapped and  the screws placed in alternating fashion.  Once all seven screws were in  place they were fully tightened with a final tightener.  The wound was  irrigated with bacitracin solution, checked for hemostasis which was  established and confirmed.  X-ray was taken which showed the grafts at C3-4,  C4-5, C5-6 and C6-7 to be in good position.  The screws at C3, C4, C5 and C6  were seen to be in good position.  We could not see the screws at C7.  It  was elected to place a prevertebral flat Jackson-Pratt drain 10 mm in width  and it was brought out through a separate stab incision and laid over the  anterior cervical plate.  The wound was then closed in multiple layers.  The  platysma was closed with interrupted inverted 2-0 undyed Vicryl sutures.  Subcutaneous and subcuticular layer were closed with interrupted inverted 3-  0 undyed Vicryl sutures and the skin edges were approximated with DermaBond.  The patient tolerated the procedure well.  The estimated blood loss was 100  mL.  Sponge, needle and instrument counts were correct.  Following surgery,  the patient was placed in an Aspen cervical collar to be reversed from his  anesthetic, extubated and transferred to the recovery room for further care.       RWN/MEDQ  D:  09/12/2004  T:  09/12/2004  Job:  [id number]

## 2011-04-19 NOTE — Discharge Summary (Signed)
NAME:  Mitchell Harris, Mitchell Harris NO.:  [id number]   MEDICAL RECORD NO.:  [id number]          PATIENT TYPE:  INP   LOCATION:  5037                         FACILITY:  [facility]   PHYSICIAN:  Lenard Galloway. Mortenson, M.D.DATE OF BIRTH:  03/18/1941   DATE OF ADMISSION:  01/05/2008  DATE OF DISCHARGE:  01/08/2008                               DISCHARGE SUMMARY   ADMISSION DIAGNOSIS:  Osteoarthritis of the right knee.   DISCHARGE DIAGNOSES:  1. Osteoarthritis of the right knee.  2. Acute post hemorrhagic anemia.  3. Hyperosmolality.  4. Cardiac dysrhythmia.  5. Hyperlipidemia.  6. Esophageal reflux.  7. Leukocytosis.  8. Hypokalemia.   PROCEDURE:  Right total knee arthroplasty.   HISTORY:  Mr. Mitchell Harris is a 70 year old white married male status post  right medial meniscectomy in 2001.  At that time, he had areas of bone-  on-bone noted on the medial femoral condyle.  Over the past years,  he  has had worsening constant severe sharp stabbing pain.  Worsens with  activity.  Has had Synvisc injections and corticosteroid injections  without help.  Radiographic end-stage osteoarthritis of the right knee  is noted.  He is for a right total knee arthroplasty.   HOSPITAL COURSE:  A 70 year old white male admitted January 04, 2008  after appropriate laboratory studies were obtained as well as 1 gram  Ancef IV on-call to the operating room.  He was taken to the operating  room where he underwent a right total knee arthroplasty by Dr. Rinaldo Ratel assisted by Oris Drone. Petrarca, P.A.-C.  He tolerated procedure  well.  He was placed on a Dilaudid PCA pump, reduced dose for  postoperative pain management.  Arixtra was started 2.5 mg subcutaneous  every 8:00 a.m. beginning on January 06, 2008.  A Foley was placed  intraoperatively.  CPM was placed from 0 to 90 for 68 hours per day.  Foot pumps were also placed.  A consultation with PT and OT and care  management was made.  He was allowed to  be partial weightbearing 50  percent on the right.  He was allowed out of bed to chair the following  day.  He was weaned off of his PCA pump and then saline locked IV.  Weaned off of the O2 to keep his sats greater than 92%.  On that day at  about 12:35 p.m. he had problems with hypoxemia and tachycardia.  A stat  chest x-ray and EKG were ordered.  Cardiac enzymes every 8 hours  x3  doses was ordered.  A consultation with [facility] was also ordered.  Once [facility] saw the patient they cancelled the remaining cardiac  panel.  Increased the IV fluids to 125 mL per hour.  Blood cultures x2  were ordered.  A urinalysis was also ordered.  TSH was ordered for the  morning.  The following day, a Doppler was obtained of his right lower  extremity to rule out DVT and this was negative.  For pain management,  he was placed on 10 mg OxyContin every 12 hours scheduled dosing.  He  was in such pain that his CPM was discontinued on the 5th.  His Robaxin  was changed from 500 mg to 1000 mg p.o. every 6 hours p.r.n. spasms.  A  D-dimer was ordered by the [facility] service.  That afternoon his  OxyContin was increased to 20 mg p.o. b.i.d.  Milk of magnesia was  ordered for problems with constipation.  On the 6th, he was noted to  have hypokalemia and this was corrected with K-Dur 40 mEq p.o. b.i.d.  Flomax was started 0.4 mg p.o. daily because of inability to void after  discontinue of Foley.  This has been a chronic problem with him in the  past.  He was written to have 1 unit of packed cells over 2 hours;  however,  this was not performed.  [facility] felt that a spiral CT was  necessary to rule out a PE.  The CT scan was negative.  Dr. Chaney Malling  personally discontinued and cancelled the blood that was to be given on  the 6th and discharged the patient to home.  He was discharged in  improved condition.  EKG of December 30, 2007 revealed normal sinus  rhythm.  EKG of January 06, 2008 revealed sinus  tachycardia with  nonspecific ST and T-wave abnormalities.  Radiographic studies January 05, 2008 chest x-ray with no acute disease, of January 06, 2008 chest x-  ray revealed low lung volumes, no active disease.  CT angio of the chest  on January 08, 2008 revealed no emboli identified.  Mitral calcification  noted.  Diffuse fatty infiltration of liver.  Right lower lobe  subsegmental atelectasis.  Thoracic spondylosis.   LABORATORY STUDIES:  Admitted with hemoglobin 14.1, hematocrit 42.1  percent, white count 9900, platelets 297,000.  Discharge hemoglobin 8.8,  hematocrit 25.5 percent, white count 10,400, platelets 195 pounds.  Preop pro time 12.8, INR 0.9, PTT 27.  A D-dimer of February 5 was 1.66.  Chemistry:  Sodium 139, potassium 4.1, chloride 104, CO2 25, glucose  100, BUN 21, creatinine 1.08, GFR of 60, total protein 6.9, albumin 4.3,  AST 27, ALT 29, ALP 38, total bilirubin 0.8.  Discharge sodium 135,  potassium 3.3, chloride 105.  CO2 25, glucose 113, BUN 8, and creatinine  1.04.  CK was 393, MB 2.4, index 0.6, troponin 0.02.  TSH was 0.693.  Urinalysis 128, was benign for voided urine.  Urinalysis of February 4  revealed a small amount of hemoglobin, 0 to 2 white, 0 to 2 red.  Blood  type A+, antibody screen negative.  Blood cultures x2 showed no growth  x2 days.  Urine culture of 02/04 showed no growth.   DISCHARGE INSTRUCTIONS:  He was discharged with no restriction on diet.  He will follow the blue instruction sheet.  Keep his incision clean and  dry and covered daily.  He will increase activity slowly using his  walker, 50 percent weightbearing.  He may shower or bathe on Saturday.  No tub baths.  No lifting or driving for 6 weeks.  CPM 0 to 70 degrees  for 6 to 8 hours per day and may increase 5 to 10 degrees daily as  tolerated.  Prescription Percocet 5/325 1 to 2 tabs every four hours as  needed for pain.  Robaxin 5 mg 1 tablet every six as needed for spasm.  Arixtra  2.5 mg inject daily at 8:00 a.m. as instructed.  The last day  being January 15, 2008.  Begin aspirin 81 mg  on January 16, 2008.  OxyContin 20 mg 1 tablet every 12 hours.  He will continue with his  Nexium, Tricor, Lipitor, iron.  He will stop his meloxicam.  He will  follow up with Dr. Chaney Malling on January 18, 2008 and will need to call  for an appointment. Genevieve Norlander for his home health.  Discharged in improved  condition.      Oris Drone Petrarca, P.A.-C.      Rodney A. Chaney Malling, M.D.  Electronically Signed    BDP/MEDQ  D:  01/22/2008  T:  01/23/2008  Job:  [id number]

## 2011-04-19 NOTE — H&P (Signed)
NAME:  MACKY, GALIK.:  [id number]   MEDICAL RECORD NO.:  [id number]          PATIENT TYPE:  INP   LOCATION:  2899                         FACILITY:  [facility]   PHYSICIAN:  Hewitt Shorts, M.D.DATE OF BIRTH:  1941/02/12   DATE OF ADMISSION:  09/12/2004  DATE OF DISCHARGE:                                HISTORY & PHYSICAL   HISTORY OF PRESENT ILLNESS:  The patient is a 70 year old right-handed white  male who is evaluated regarding cervical spondylosis and stenosis.  He  explains that he has had difficulties off and on over the years in his upper  extremities as well as his lower extremities.  This past summer he developed  pain from his neck extending into the right shoulder and possible right arm.  He then began to develop weakness and atrophy in the proximal right upper  extremity with numbness and tingling from the right arm, forearm, and hand.  He explains that 25 to 30 years ago he developed atrophy in the right lower  extremity.  He was operated on the right lower extremity, never recovered  muscular strength or bulk.  He reports that 20 years ago he developed pain,  tingling, and weakness associated with burning through the right upper  extremity.  The strength gradually recovered.  Fifteen years ago he had an  episode of pain, muscle spasms, weakness in the left upper extremity.  He  underwent extensive therapy and EMG nerve conduction studies and recovered  his strength from that as well.  The patient was evaluated with a MRI scan  of his cervical spine as well as x-rays.  X-rays show multi-level  degenerative disk disease and spondylosis of C3-4, C4-5, C5-6, and C6-7,  with ventral and dorsal osteophytic overgrowth.  The alignment is good and  is stable to flexion and extension.  In fact, he has limited motion to  flexion and extension particularly at the C3-4, C4-5, C5-6, and C5-6 levels.  MRI scan shows extensive degenerative disk disease and  spondylosis with  spondylitic disk protrusions and significant canal stenosis, C3-4, C4-5, C5-  6, and C6-7, with the worst protrusion seen at the C3-4 level with  significant canal stenosis at that level, but as well at the other levels.  There may be some increased signal within the spinal cord at the C3-4 level.   The patient is admitted now for four level, C3-4, C4-5, C5-6, and C6-7  anterior cervical diskectomy with arthrodesis with allograft and cervical  plating.   PAST MEDICAL HISTORY:  1.  History of hiatal hernia.  2.  Gastroesophageal reflux disease.  3.  Acid reflux.   No history of hypertension, myocardial infarction, cancer, stroke, diabetes,  or lung disease.   PAST SURGICAL HISTORY:  1.  Numerous bilateral knee surgeries.  Left knee replacement in 2000, right      knee replacement has been recommended.  Performed by Dr. Chaney Malling.  2.  Numerous bilateral shoulder surgeries.  Performed by Dr. Teressa Senter.  3.  Cholecystectomy in May 2004.   ALLERGIES:  CODEINE causing facial swelling.   CURRENT  MEDICATIONS:  1.  Lipitor 20 mg daily.  2.  Tricor 145 mg daily.  3.  Nexium 40 mg daily.  4.  OxyContin p.r.n.   FAMILY HISTORY:  His parents have passed on.  There is a family history of  hypertension, stroke, colon cancer, and arthritis.   SOCIAL HISTORY:  The patient is semi-retired.  He does some electrical  inspections.  He is married.  He does not smoke, drink alcoholic beverages,  or have a history of substance abuse.   REVIEW OF SYSTEMS:  Notable for what is described in his history of present  illness and past medial history, but is otherwise unremarkable.   PHYSICAL EXAMINATION:  GENERAL:  The patient is a well-developed, well-  nourished white male in no acute distress.  VITAL SIGNS:  Temperature 97.5, pulse 80, blood pressure 148/81, respiratory  rate 16, height 5 feet 8 inches, weight 192 pounds.  LUNGS:  Clear to auscultation.  He has symmetrical  respiratory excursion.  HEART:  Regular rate and rhythm, S1 and S2, there is no murmur.  ABDOMEN:  Soft, nondistended, bowel sounds are present.  EXTREMITIES:  No cyanosis, clubbing, or edema.  MUSCULOSKELETAL:  No tenderness over the cervical spine or paracervical  musculature, has good range of motion of the neck.  NEUROLOGIC:  Profound atrophy of the right deltoid, biceps, and scapular  musculature, as well as profound atrophy of the right leg musculature.  There is also relative atrophy in the right pectoral muscles compared to the  left pectoral muscles.  Motor examination shows his strength in the right  upper extremity in the deltoid is 3, the biceps and triceps are 4, and  intrinsic grip are 5.  In the left upper extremity, the deltoid, biceps,  triceps, intrinsic grip are all 5.  Sensation is intact to pinprick to the  upper extremities.  Reflexes are 2 to 3 in the left biceps and brachial  radialis, the right biceps is trace, the right brachial radialis is absent,  and the triceps are 2 to 3 bilaterally.  No Hoffman's are noted.  Left  quadriceps to the right are absent.  The left gastrocnemius is 1 to 2, the  right is absent.  Left toe is downgoing.  The right toe shows little motion.  He has a normal gait and stance.   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   PLAN:  The patient will be admitted for a four level anterior cervical  diskectomy with arthrodesis with allograft and cervical plating.  We have  discussed alternatives to surgery, the nature of his condition, the nature  of the surgical procedure.  We discussed the ___________surgery,  hospitalization, overall ____________, limitations, postoperative need for postoperative normalization of Aspen cervical collar, and risks  or surgery  including risk of infection, bleeding, possible need for transfusion, the  risk of nerve dysfunction, pain, weakness, numbness, or paresthesias, the  risk of spinal cord dysfunction with paralysis of all four limbs and  quadriplegia, the risk of failure  of the arthrodesis and possible need for further surgery, and anesthetic  risks of myocardial infarction, stroke, pneumonia, and death.  After  discussing all of this with the patient and answering all of his questions,  he would like to go ahead with surgery and is admitted for such.  RWN/MEDQ  D:  09/12/2004  T:  09/12/2004  Job:  [id number]

## 2011-04-19 NOTE — Op Note (Signed)
NAME:  Mitchell, Harris NO.:  [id number]   MEDICAL RECORD NO.:  [id number]          PATIENT TYPE:  AMB   LOCATION:  DSC                          FACILITY:  [facility]   PHYSICIAN:  Katy Fitch. Sypher, M.D. 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   OPERATION:  1.  Recovery of retracted extensor pollicis longus tendon laceration,      proximal stump.  2.  Reconstruction of extensor pollicis longus left thumb, with 0.045 inch      Kirschner wire fixation of left thumb IP joint and 10 degrees of      hyperextension.   OPERATION SURGEON:  Katy Fitch. Sypher, M.D.   ASSISTANT:  Mitchell Maduro Dasnoit PA-C.   ANESTHESIA:  General by LMA.   SUPERVISING ANESTHESIOLOGIST:  Dr. Krista Blue.   INDICATIONS:  Mitchell Harris is a 70 year old gentleman who is an established  patient of our practice. He has a history of sustaining a complex laceration  to the dorsal aspect of his left thumb 2 weeks prior. He was seen at the  [facility] emergency room, where his wound was cleaned and sutured.   He noted, after his injury, an extensor lag of his thumb at the IP joint. As  he was familiar with our practice, he sought  a consultation for evaluation  and management.   Clinical exam revealed a 45-degree extensor lag of the thumb IP joint, with  full passive extension equal to the right.  We recommended exploration on an  as-soon-as-possible basis, anticipating the need to recover his proximal  tendon stump and perform a secondary repair of the extensor pollicis longus.   He was brought to the operating room at this time. Preoperatively  we advised  Mr. Mitchell Harris that he would benefit from internal fixation of his  interphalangeal joint and hyperextension to relieve tension on the repair.   After informed consent he is brought to the operating room at this time.   PROCEDURE:  Mitchell Harris was brought to the operating room and placed in the  supine position on the operating room table.   Following the induction of general anesthesia by LMA, the left arm was  prepped with Betadine soap solution and sterilely draped. Following  exsanguination of the left arm with an Esmarch bandage, an arterial  tourniquet at the proximal brachium was inflated to  230 mmHg. 1 gram of  Ancef had been administered as an IV prophylactic antibiotic in the holding  area.   The procedure commenced with extension of his transverse traumatic wound,  with proximal and distal longitudinal incisions creating a lazy S incision.  Proximal dissection revealed the stump retracted about 1.5 cm at the level  of the MP joint, held in place  by the sagittal fibers. The distal stump of  the tendon was identified and mobilized.  Pseudo tendon was resected and a  plane was created between the periosteum of the proximal phalanx and the  proximal and distal tendon portions of the extensor pollicis longus tendon.   The extensor pollicis longus tendon was repaired with 2 grasping sutures of  3-0 Ethibond, followed by a finishing suture of 3-0 Ethibond. Anatomic  repair was achieved.  The wound was then irrigated and bleeding points  electrocauterized with bipolar current, followed by repair of the skin with  interrupted sutures of 5-0 nylon.   A forearm based thumb spica splint was applied, with the thumb MP and CMC  joints in radial abduction and full extension.   There were no apparent complications.  Mr. Mitchell Harris tolerated the surgery and  anesthesia well.   For aftercare,  he was transferred to the recovery room for observation of  his vital signs. He will be  provided prescriptions for Dilaudid 2 mg 1-2  tablets p.o. q. 4-6 hours p.r.n. Pain (20 tablets without refill); also  Motrin 600 mg  1 p.o. q.6 h p.r.n. pain.      Katy Fitch Sypher, M.D.  Electronically Signed     RVS/MEDQ  D:  08/27/2005  T:  08/28/2005  Job:  [id number]

## 2011-04-19 NOTE — Op Note (Signed)
[place]  Patient:    Mitchell Harris, Mitchell Harris                       MRN: [id number] Proc. Date: 03/04/00 Adm. Date:  [id number] Attending:  Cornell Barman                           Operative Report  PREOPERATIVE DIAGNOSIS:  Severe osteoarthritis of the left knee status post high tibial osteotomy with staple fixation.  POSTOPERATIVE DIAGNOSIS:  Severe osteoarthritis of the left knee status post high tibial osteotomy with staple fixation.  OPERATION:  Removal of staples from medial lateral tibial plateau, left knee and Osteonics total knee replacement on the left using a press fit stabilized femoral component, a size #9, 7000 tibial tray standard which was glued in with a #9, 12 mm posterior stabilized tibial bearing insert and a #9 domed patella which was glued in.  SURGEON:  Lenard Galloway. Chaney Malling, M.D.  ASSISTANT:  Jamelle Rushing, P.A.  ANESTHESIA;  General.  DESCRIPTION OF PROCEDURE:  The patient was placed on the operating table in the  supine position with pneumatic tourniquet about the left upper thigh.  The left  lower extremity was prepped with DuraPrep and draped in the usual manner. Vi-Drape was placed around the operative site.  The leg was wrapped with an Esmarch. The tourniquet was elevated.  A long incision based above patella and carried down o the tibial tubercle.  The skin edges were retracted.  Blunt dissection was carried out to the lateral tibial plateau.  The lateral staple was isolated and the soft tissue was excised.  There was some bony overgrowth, and this was removed with n osteotome.  The staple was then removed successfully.  Dissection was carried around to the medial side of the knee and a small medial staple was found. Subcutaneous dissection was carried down to the staple and the staple was then removed with a small periosteal elevator.  At this point, a long median parapatellar incision was made  and the patella was reflected laterally.  The patient had significant osteoarthritis about the knee.  The knee was flexed 90 degrees.  Both medial and lateral meniscus were excised.  The anterior and posterior cruciate were excised.  At this point, an initial cut was made across the proximal tibial plateau.  A drill hole was then placed in this area.  Because of the restricted space, it was felt that resection of the femur was the next step. The drill hole was placed in the distal end of the femur and an intermedullary od was inserted.  A 5 degree left primary femoral guide was placed on the femur, and this measured 10 mm.  This was fixed in place with pins.  The distal femoral cut was then made.  At this point, AP sizing guide was placed over the distal end of the femur.  The keel was placed over the posterior femoral condyle and was lifted. The stylus was set to the corresponding size and this fit a #9.  This was pinned with pins.  The sizing guide was then removed.  The chamfer resection guide was  placed in the pin holes.  This was brought down flush with the anterior aspect f the distal end of the femur with a hammer and held in place with fixation pins.  The four cuts were made.  Anterior, posterior and chamfer  cuts were made.  Once  this was accomplished, a trial femoral component was placed over the distal end of the femur, and this fit very nicely.  This was removed and a Scorpio onlay block was put in place and held in position with fixation pins and guides.  The patellar resection was prepared with the punch.  The intercondylar area was then notched  with the punch and this was debrided with a rongeur.  The impaction punch was then used to depress the bone in the femoral notch area.  This went very easily.  At  this point, the trial component was placed over the distal end of the femur and  attention was turned to the proximal tibia.  Care was used to sublux the  tibia forward.  The rest of the posterior horn of the medial and lateral meniscus were excised.  Good visualization was achieved.  Several sizes were tried and a tibial trial #9 seemed to be the appropriate size.  This was held in position with the  insert flexed and extended, and marks were made for the rotational alignment. His was marked on the proximal end of the tibia.  The tibial keel punch was placed ver the proximal end of the tibia and held in aligned position.  This was fixed with four pins.  The trial was inserted and a series of tibial punches were inserted to that it would fit a glued prosthesis.  There was excellent alignment.  At this point, attention was turned to the posterior aspect of the patella.  The patellar resection guide was placed over the posterior aspect of the patella and the patella was amputated.  This was felt to still be too thick and an additional 2 mm was resected.  Several sized patellar trials were inserted, and the #9, 10 mm thick  patella was selected.  Drill holes were made and the trial was put in position.  The knee was then flexed and extended with 12 mm tibial tray.  There was full flexion and full extension and the patella tracked very nicely midline without assistance.  At this point, all components were removed.  The pulsating lavage as used to remove all debris.  The glue was mixed.  The proximal tibia was then exposed and glue was placed in the proximal end of the tibia and down the keel area.  The Osteonics 7000 #9 tibial tray was inserted no acute distress held in  position.  Excess glue was removed.  This was impacted very tightly.  A press fit femoral component was then selected and placed over the distal end of the femur. The trial tibial insert was inserted and locked in place and the knee put through full extension to compress the glue on the tibial tray.  Glue was then placed in the posterior aspect of the patella and  the patellar dome was inserted and held in place with a clamp.  All glue was removed.  The knee was irrigated with antibiotic solution.  Once the glue had set up, the knee was flexed 90 degrees.  The tibial tray trial was then removed.  The tourniquet was dropped.  All bleeders were  coagulated.  The final tibial bearing insert, 12 mm thick #9, was inserted and snapped in place.  The knee was put through full range of motion.  This was extremely stable with full flexion and extension.  A Hemovac drain was inserted. The long medial parapatellar incision was closed with heavy Tycron suture.  Vicryl was used for the subcutaneous tissue and stainless steel staples were used to close the skin.  Sterile dressings were applied and the patient returned to  the recovery room in excellent condition.  Technically, this went extremely well. DD:  03/04/00 TD:  03/04/00 Job: [id number]

## 2011-04-19 NOTE — Discharge Summary (Signed)
[place]  Patient:    Mitchell Harris, Mitchell Harris                       MRN: [id number] Adm. Date:  [id number] Disc. Date: [id number] Attending:  Cornell Barman Dictator:   Jamelle Rushing, P.A.-C. CC:         Quita Skye. Artis Flock, M.D.                           Discharge Summary  ADMITTING DIAGNOSES: 1. End stage osteoarthritis, left knee. 2. Hypercholesterolemia. 3. Hypertriglyceridemia.  DISCHARGE DIAGNOSES: 1. Status post left total knee arthroplasty. 2. Postoperative blood loss anemia. 3. Hypercholesterolemia. 4. Hypertriglyceridemia. 5. Constipation.  HISTORY OF PRESENT ILLNESS:  Patient is a 70 year old male with an approximately 20-year history of left knee pain.  The patient has had multiple arthroscopic evaluations with minimal improvement.  In 1987, the patient had a high tibial osteotomy with good results and significant improvement for about 10 years.  Patient has been having increased pain over the last few months. Patient occasionally has sharp shooting pains in the knee with any lifting. The pain is mostly a constant aching sensation with no radiation of the pain. Patient does have popping, grinding, and a warmth feeling in the knee. Patient has no swelling.  Patient currently using Vioxx and Darvocet with little improvement.  ALLERGIES:  No known drug allergies.  MEDICATIONS: 1. Lipitor 20 mg p.o. q.d. 2. Tricor 200 mg p.o. q.d. 3. Darvocet N-100 p.r.n. 4. Vioxx 25 mg p.o. q.d.  SURGICAL PROCEDURE:  On March 04, 2000, patient was taken to the OR by Dr. Lenard Galloway. Mortenson and a removal of staples from a mediolateral tibial plateau and a resultant left total knee arthroplasty was performed.  Hemovac drain was left in place.  The patient tolerated the surgical procedure well and was transferred to the recovery room and then to the orthopedic floor without any further complications.  CONSULTS:  On March 04, 2000, the patient had  the following routine consults: Physical therapy, occupational therapy, rehab, and pharmacy for Coumadin dosing.  HOSPITAL COURSE:  On March 04, 2000, patient was admitted to [facility] under the care of Dr. Thereasa Distance A. Mortenson.  Patient went to the OR where a left total knee arthroplasty was performed after removal of staples from a high tibial osteotomy.  Patient had one Hemovac left in place and was transferred to the recovery room in good condition.  On postoperative day #1, patient complained of pain in his left knee with the CPM.  He did have some problems with the PCA over the evening but, presently, was feeling much better once they were corrected.  The CPM was at 0 to 30 degrees.  H&H was 9.7 and 28.5 with a 1.3 INR.  The left leg had a moderate amount of bloody discharge and the Hemovac had about 90 cc out.  The dressing was removed and the wound was well approximated with staples with no sign of erythema and the bandaging was replaced.  The patient was continued on his morphine PCA and continued to progress with physical therapy.  On postoperative day #2, patient was resting more comfortably without any complaints, was tolerating his CPM well, and was up to 50 degrees.  Patient did have a slight itch on the posterior aspect of his back and his legs, and his H&H was 9.7 and 28.5, with  a 1.3 INR.  Patients left knee wound was benign.  His distal leg was neuromotor vascularly intact.  He did have a 101.4 temperature previously in the day but, otherwise, his vital signs were stable. The plan today was to discontinue his IV fluids, his PCA and his Foley, and just Hep-Lock his catheterization and change him over to p.o. pain medicines and progress him a little bit more aggressively with physical therapy and continue to monitor his temperature and H&H.  On postoperative day #3, patient complained of not having a bowel movement. He was tolerating the CPM well up to 55  degrees.  He did have a 100.9 temperature earlier in the day but, currently, was afebrile.  Saturations were 97% on room air.  H&H dropped to 8.1 and 23.9 with a 1.9 INR.  His wound was benign.  Distal leg was neuromotor vascularly intact.  Due to the patients complaints of constipation, we would give him a laxative of choice today.  Due to his drop in H&H, patient has autologous blood available, so we retransfused his two units of blood and rechecked his H&H in the morning.  The patient was still encouraged to deep breathe and cough, and use his incentive spirometer. On postoperative day #4, patient had absolutely no complaints, was very comfortable, and stated he was ready to go home, he had had a bowel movement, and that he was tolerating his diet well.  Vital signs were stable.  His temperature was 100.6.  His saturations were 95% on room air.  Patients H&H had improved after two units to 9.6 and 27.2 with an INR of 1.7.  His left knee incision was without any edema and had a moderate amount of ecchymosis, but distal leg was neuromotor vascularly intact.  Due to the patients wish to go home, we had physical therapy work with him more aggressively on the stairs and then discharge him later in the day after pharmacy had dosed his Coumadin levels.  On discharge, patient was to get home health physical therapy and home health R.N. to draw pro times.  DISCHARGE INSTRUCTIONS: 1. Patient is to resume all previous medications. 2. OxyContin CR 10 mg p.o. q.12h. 3. OxyIR one to two tablets every 4 to 6 hours p.r.n. pain. 4. Coumadin per pharmacy dosing starting at 2.5 mg p.o. q.d. 5. Prothrombin time is to be drawn on April 9, 16, and 23.  Results called to    [facility]. 6. Activity:  Patient is to maintain partial weightbearing, 50%, with the use    of a walker. 7. Wound care:  Patient is to check his wound daily for infection, any    increased redness, pain, or temperature, or  discharge from the wound.  If    present, to call physician.   SPECIAL INSTRUCTIONS:  Patient is supposed to have home health R.N. and physical therapy to be provided by [facility].  FOLLOW-UP:  Follow-up is to be called at [phone] for an appointment.  LABORATORY DATA:  EKG on admission was normal sinus rhythm at 72 beats per minute.  CBC on admission found WBCs at 6.6, hemoglobin 12.9, hematocrit 37.8, platelets 377.  On April 4, WBCs were 12.8, hemoglobin 9.7, hematocrit 28.5, and platelets 301.  On April 5, H&H was 8.8 and 26.2.  On April 7, H&H was 9.6 and 27.2.  PT on discharge was 16.8 with a 1.7 INR.  Routine chemistries on admission were within normal limits.  Urinalysis on admission  was normal. Patient received two units of autologous blood during the hospitalization.  DISCHARGE MEDICATIONS: 1. OxyContin CR 10 mg p.o. q.12h. 2. Tricor 200 mg p.o. q.d. 3. Coumadin 2.5 mg p.o. q.d. 4. Zocor 40 mg p.o. q.d. 5. Docusate 100 mg p.o. b.i.d. 6. OxyIR 5 mg one or two tablets every 4 to 6 hours p.r.n. pain.  CONDITION ON DISCHARGE:  Patients condition on discharge to home is listed as good. DD:  04/02/99 TD:  04/02/00 Job: [id number]

## 2011-06-14 ENCOUNTER — Encounter: Payer: Self-pay | Admitting: Internal Medicine

## 2011-08-23 LAB — CARDIAC PANEL(CRET KIN+CKTOT+MB+TROPI)
CK, MB: 2.4
Relative Index: 0.6

## 2011-08-23 LAB — URINALYSIS, ROUTINE W REFLEX MICROSCOPIC
Bilirubin Urine: NEGATIVE
Glucose, UA: NEGATIVE
Glucose, UA: NEGATIVE
Hgb urine dipstick: NEGATIVE
Ketones, ur: NEGATIVE
Ketones, ur: NEGATIVE
Leukocytes, UA: NEGATIVE
Protein, ur: NEGATIVE
Protein, ur: NEGATIVE

## 2011-08-23 LAB — CBC
HCT: 32 — ABNORMAL LOW
Hemoglobin: 14.1
MCHC: 33.5
MCHC: 34.1
MCHC: 34.3
MCHC: 34.3
MCV: 86.6
MCV: 86.7
Platelets: 195
Platelets: 215
RBC: 4.85
RBC: 2.95 — ABNORMAL LOW
RBC: 3.7 — ABNORMAL LOW
RDW: 13.3
RDW: 13.4

## 2011-08-23 LAB — CULTURE, BLOOD (ROUTINE X 2): Culture: NO GROWTH

## 2011-08-23 LAB — BASIC METABOLIC PANEL
BUN: 19
BUN: 8
CO2: 26
Calcium: 8.4
Chloride: 101
Creatinine, Ser: 1.04
Creatinine, Ser: 1.23
GFR calc Af Amer: >60
GFR calc Af Amer: >60

## 2011-08-23 LAB — COMPREHENSIVE METABOLIC PANEL
ALT: 29
Alkaline Phosphatase: 38 — ABNORMAL LOW
CO2: 25
GFR calc non Af Amer: >60
Glucose, Bld: 100 — ABNORMAL HIGH
Potassium: 4.1
Sodium: 139
Total Bilirubin: 0.8

## 2011-08-23 LAB — D-DIMER, QUANTITATIVE: D-Dimer, Quant: 1.66 — ABNORMAL HIGH

## 2011-08-23 LAB — CROSSMATCH
ABO/RH(D): A POS
ABO/RH(D): A POS
Antibody Screen: NEGATIVE

## 2011-08-23 LAB — DIFFERENTIAL
Eosinophils Absolute: 0.4
Eosinophils Relative: 4
Lymphs Abs: 2.1
Monocytes Relative: 5

## 2011-08-23 LAB — URINE CULTURE
Colony Count: NO GROWTH
Culture: NO GROWTH

## 2011-08-23 LAB — PROTIME-INR: Prothrombin Time: 12.8

## 2012-08-24 ENCOUNTER — Encounter: Payer: Self-pay | Admitting: Internal Medicine

## 2012-09-11 ENCOUNTER — Encounter (INDEPENDENT_AMBULATORY_CARE_PROVIDER_SITE_OTHER): Payer: Medicare Other | Admitting: Ophthalmology

## 2012-09-11 DIAGNOSIS — H35729 Serous detachment of retinal pigment epithelium, unspecified eye: Secondary | ICD-10-CM

## 2012-09-11 DIAGNOSIS — H353290 Exudative age-related macular degeneration, unspecified eye, stage unspecified: Secondary | ICD-10-CM

## 2012-09-11 DIAGNOSIS — H3530 Unspecified macular degeneration: Secondary | ICD-10-CM

## 2012-09-11 DIAGNOSIS — I10 Essential (primary) hypertension: Secondary | ICD-10-CM

## 2012-09-11 DIAGNOSIS — H43819 Vitreous degeneration, unspecified eye: Secondary | ICD-10-CM

## 2012-09-11 DIAGNOSIS — H35039 Hypertensive retinopathy, unspecified eye: Secondary | ICD-10-CM

## 2012-09-15 ENCOUNTER — Ambulatory Visit (INDEPENDENT_AMBULATORY_CARE_PROVIDER_SITE_OTHER): Payer: Medicare Other | Admitting: Ophthalmology

## 2012-09-15 DIAGNOSIS — H353290 Exudative age-related macular degeneration, unspecified eye, stage unspecified: Secondary | ICD-10-CM

## 2012-09-15 DIAGNOSIS — I10 Essential (primary) hypertension: Secondary | ICD-10-CM

## 2012-09-15 DIAGNOSIS — H35039 Hypertensive retinopathy, unspecified eye: Secondary | ICD-10-CM

## 2012-09-15 DIAGNOSIS — H3530 Unspecified macular degeneration: Secondary | ICD-10-CM

## 2012-09-15 DIAGNOSIS — H2510 Age-related nuclear cataract, unspecified eye: Secondary | ICD-10-CM

## 2012-09-15 DIAGNOSIS — H43819 Vitreous degeneration, unspecified eye: Secondary | ICD-10-CM

## 2012-10-09 ENCOUNTER — Encounter (INDEPENDENT_AMBULATORY_CARE_PROVIDER_SITE_OTHER): Payer: Medicare Other | Admitting: Ophthalmology

## 2012-10-09 DIAGNOSIS — H3530 Unspecified macular degeneration: Secondary | ICD-10-CM

## 2012-10-09 DIAGNOSIS — H2510 Age-related nuclear cataract, unspecified eye: Secondary | ICD-10-CM

## 2012-10-09 DIAGNOSIS — I10 Essential (primary) hypertension: Secondary | ICD-10-CM

## 2012-10-09 DIAGNOSIS — H353290 Exudative age-related macular degeneration, unspecified eye, stage unspecified: Secondary | ICD-10-CM

## 2012-10-09 DIAGNOSIS — H35039 Hypertensive retinopathy, unspecified eye: Secondary | ICD-10-CM

## 2012-10-09 DIAGNOSIS — H43819 Vitreous degeneration, unspecified eye: Secondary | ICD-10-CM

## 2012-11-06 ENCOUNTER — Encounter (INDEPENDENT_AMBULATORY_CARE_PROVIDER_SITE_OTHER): Payer: Medicare Other | Admitting: Ophthalmology

## 2012-11-06 DIAGNOSIS — H35039 Hypertensive retinopathy, unspecified eye: Secondary | ICD-10-CM

## 2012-11-06 DIAGNOSIS — H353290 Exudative age-related macular degeneration, unspecified eye, stage unspecified: Secondary | ICD-10-CM

## 2012-11-06 DIAGNOSIS — H3530 Unspecified macular degeneration: Secondary | ICD-10-CM

## 2012-11-06 DIAGNOSIS — I10 Essential (primary) hypertension: Secondary | ICD-10-CM

## 2012-12-01 ENCOUNTER — Encounter (INDEPENDENT_AMBULATORY_CARE_PROVIDER_SITE_OTHER): Payer: Medicare Other | Admitting: Ophthalmology

## 2012-12-01 DIAGNOSIS — I10 Essential (primary) hypertension: Secondary | ICD-10-CM

## 2012-12-01 DIAGNOSIS — H35039 Hypertensive retinopathy, unspecified eye: Secondary | ICD-10-CM

## 2012-12-01 DIAGNOSIS — H353290 Exudative age-related macular degeneration, unspecified eye, stage unspecified: Secondary | ICD-10-CM

## 2012-12-01 DIAGNOSIS — H43819 Vitreous degeneration, unspecified eye: Secondary | ICD-10-CM

## 2012-12-01 DIAGNOSIS — H2510 Age-related nuclear cataract, unspecified eye: Secondary | ICD-10-CM

## 2012-12-01 DIAGNOSIS — H3530 Unspecified macular degeneration: Secondary | ICD-10-CM

## 2012-12-30 ENCOUNTER — Encounter (INDEPENDENT_AMBULATORY_CARE_PROVIDER_SITE_OTHER): Payer: Medicare Other | Admitting: Ophthalmology

## 2012-12-30 DIAGNOSIS — H2510 Age-related nuclear cataract, unspecified eye: Secondary | ICD-10-CM

## 2012-12-30 DIAGNOSIS — H353290 Exudative age-related macular degeneration, unspecified eye, stage unspecified: Secondary | ICD-10-CM

## 2012-12-30 DIAGNOSIS — I10 Essential (primary) hypertension: Secondary | ICD-10-CM

## 2012-12-30 DIAGNOSIS — H43819 Vitreous degeneration, unspecified eye: Secondary | ICD-10-CM

## 2012-12-30 DIAGNOSIS — H3530 Unspecified macular degeneration: Secondary | ICD-10-CM

## 2012-12-30 DIAGNOSIS — H35039 Hypertensive retinopathy, unspecified eye: Secondary | ICD-10-CM

## 2013-01-25 ENCOUNTER — Encounter (INDEPENDENT_AMBULATORY_CARE_PROVIDER_SITE_OTHER): Payer: Medicare Other | Admitting: Ophthalmology

## 2013-01-25 DIAGNOSIS — H353290 Exudative age-related macular degeneration, unspecified eye, stage unspecified: Secondary | ICD-10-CM

## 2013-01-25 DIAGNOSIS — H3530 Unspecified macular degeneration: Secondary | ICD-10-CM

## 2013-02-22 ENCOUNTER — Encounter (INDEPENDENT_AMBULATORY_CARE_PROVIDER_SITE_OTHER): Payer: Medicare Other | Admitting: Ophthalmology

## 2013-02-22 DIAGNOSIS — H35039 Hypertensive retinopathy, unspecified eye: Secondary | ICD-10-CM

## 2013-02-22 DIAGNOSIS — H43819 Vitreous degeneration, unspecified eye: Secondary | ICD-10-CM

## 2013-02-22 DIAGNOSIS — H353290 Exudative age-related macular degeneration, unspecified eye, stage unspecified: Secondary | ICD-10-CM

## 2013-02-22 DIAGNOSIS — I10 Essential (primary) hypertension: Secondary | ICD-10-CM

## 2013-02-22 DIAGNOSIS — H3530 Unspecified macular degeneration: Secondary | ICD-10-CM

## 2013-03-26 ENCOUNTER — Encounter (INDEPENDENT_AMBULATORY_CARE_PROVIDER_SITE_OTHER): Payer: Medicare Other | Admitting: Ophthalmology

## 2013-03-26 DIAGNOSIS — H35039 Hypertensive retinopathy, unspecified eye: Secondary | ICD-10-CM

## 2013-03-26 DIAGNOSIS — H353290 Exudative age-related macular degeneration, unspecified eye, stage unspecified: Secondary | ICD-10-CM

## 2013-03-26 DIAGNOSIS — I10 Essential (primary) hypertension: Secondary | ICD-10-CM

## 2013-03-26 DIAGNOSIS — H3530 Unspecified macular degeneration: Secondary | ICD-10-CM

## 2013-04-16 ENCOUNTER — Encounter (INDEPENDENT_AMBULATORY_CARE_PROVIDER_SITE_OTHER): Payer: Medicare Other | Admitting: Ophthalmology

## 2013-04-16 DIAGNOSIS — H353290 Exudative age-related macular degeneration, unspecified eye, stage unspecified: Secondary | ICD-10-CM

## 2013-04-16 DIAGNOSIS — H2510 Age-related nuclear cataract, unspecified eye: Secondary | ICD-10-CM

## 2013-04-16 DIAGNOSIS — H3530 Unspecified macular degeneration: Secondary | ICD-10-CM

## 2013-04-16 DIAGNOSIS — I10 Essential (primary) hypertension: Secondary | ICD-10-CM

## 2013-04-16 DIAGNOSIS — H35039 Hypertensive retinopathy, unspecified eye: Secondary | ICD-10-CM

## 2013-04-16 DIAGNOSIS — H43819 Vitreous degeneration, unspecified eye: Secondary | ICD-10-CM

## 2013-05-13 ENCOUNTER — Encounter (INDEPENDENT_AMBULATORY_CARE_PROVIDER_SITE_OTHER): Payer: Medicare Other | Admitting: Ophthalmology

## 2013-05-13 DIAGNOSIS — H3530 Unspecified macular degeneration: Secondary | ICD-10-CM

## 2013-05-13 DIAGNOSIS — H2510 Age-related nuclear cataract, unspecified eye: Secondary | ICD-10-CM

## 2013-05-13 DIAGNOSIS — H353290 Exudative age-related macular degeneration, unspecified eye, stage unspecified: Secondary | ICD-10-CM

## 2013-05-13 DIAGNOSIS — I10 Essential (primary) hypertension: Secondary | ICD-10-CM

## 2013-05-13 DIAGNOSIS — H35039 Hypertensive retinopathy, unspecified eye: Secondary | ICD-10-CM

## 2013-05-13 DIAGNOSIS — H43819 Vitreous degeneration, unspecified eye: Secondary | ICD-10-CM

## 2013-06-10 ENCOUNTER — Encounter (INDEPENDENT_AMBULATORY_CARE_PROVIDER_SITE_OTHER): Payer: Medicare Other | Admitting: Ophthalmology

## 2013-06-10 DIAGNOSIS — H35039 Hypertensive retinopathy, unspecified eye: Secondary | ICD-10-CM

## 2013-06-10 DIAGNOSIS — H43819 Vitreous degeneration, unspecified eye: Secondary | ICD-10-CM

## 2013-06-10 DIAGNOSIS — H3530 Unspecified macular degeneration: Secondary | ICD-10-CM

## 2013-06-10 DIAGNOSIS — H353290 Exudative age-related macular degeneration, unspecified eye, stage unspecified: Secondary | ICD-10-CM

## 2013-06-10 DIAGNOSIS — I10 Essential (primary) hypertension: Secondary | ICD-10-CM

## 2013-07-08 ENCOUNTER — Encounter (INDEPENDENT_AMBULATORY_CARE_PROVIDER_SITE_OTHER): Payer: Medicare Other | Admitting: Ophthalmology

## 2013-07-08 DIAGNOSIS — H35039 Hypertensive retinopathy, unspecified eye: Secondary | ICD-10-CM

## 2013-07-08 DIAGNOSIS — H3530 Unspecified macular degeneration: Secondary | ICD-10-CM

## 2013-07-08 DIAGNOSIS — I10 Essential (primary) hypertension: Secondary | ICD-10-CM

## 2013-07-08 DIAGNOSIS — H43819 Vitreous degeneration, unspecified eye: Secondary | ICD-10-CM

## 2013-07-08 DIAGNOSIS — H2510 Age-related nuclear cataract, unspecified eye: Secondary | ICD-10-CM

## 2013-07-08 DIAGNOSIS — H353290 Exudative age-related macular degeneration, unspecified eye, stage unspecified: Secondary | ICD-10-CM

## 2013-07-09 ENCOUNTER — Encounter (INDEPENDENT_AMBULATORY_CARE_PROVIDER_SITE_OTHER): Payer: Medicare Other | Admitting: Ophthalmology

## 2013-07-09 DIAGNOSIS — H2000 Unspecified acute and subacute iridocyclitis: Secondary | ICD-10-CM

## 2013-07-12 ENCOUNTER — Encounter (INDEPENDENT_AMBULATORY_CARE_PROVIDER_SITE_OTHER): Payer: Medicare Other | Admitting: Ophthalmology

## 2013-07-12 DIAGNOSIS — H2000 Unspecified acute and subacute iridocyclitis: Secondary | ICD-10-CM

## 2013-07-15 ENCOUNTER — Encounter (INDEPENDENT_AMBULATORY_CARE_PROVIDER_SITE_OTHER): Payer: Medicare Other | Admitting: Ophthalmology

## 2013-07-15 DIAGNOSIS — H35039 Hypertensive retinopathy, unspecified eye: Secondary | ICD-10-CM

## 2013-07-15 DIAGNOSIS — H3530 Unspecified macular degeneration: Secondary | ICD-10-CM

## 2013-07-15 DIAGNOSIS — I10 Essential (primary) hypertension: Secondary | ICD-10-CM

## 2013-07-15 DIAGNOSIS — H2000 Unspecified acute and subacute iridocyclitis: Secondary | ICD-10-CM

## 2013-07-15 DIAGNOSIS — H43819 Vitreous degeneration, unspecified eye: Secondary | ICD-10-CM

## 2013-07-15 DIAGNOSIS — H353290 Exudative age-related macular degeneration, unspecified eye, stage unspecified: Secondary | ICD-10-CM

## 2013-07-23 ENCOUNTER — Encounter (INDEPENDENT_AMBULATORY_CARE_PROVIDER_SITE_OTHER): Payer: Medicare Other | Admitting: Ophthalmology

## 2013-07-23 DIAGNOSIS — H2000 Unspecified acute and subacute iridocyclitis: Secondary | ICD-10-CM

## 2013-07-23 DIAGNOSIS — H3530 Unspecified macular degeneration: Secondary | ICD-10-CM

## 2013-07-23 DIAGNOSIS — H35039 Hypertensive retinopathy, unspecified eye: Secondary | ICD-10-CM

## 2013-07-23 DIAGNOSIS — I10 Essential (primary) hypertension: Secondary | ICD-10-CM

## 2013-07-23 DIAGNOSIS — H43819 Vitreous degeneration, unspecified eye: Secondary | ICD-10-CM

## 2013-07-23 DIAGNOSIS — H353290 Exudative age-related macular degeneration, unspecified eye, stage unspecified: Secondary | ICD-10-CM

## 2013-08-05 ENCOUNTER — Encounter (INDEPENDENT_AMBULATORY_CARE_PROVIDER_SITE_OTHER): Payer: Medicare Other | Admitting: Ophthalmology

## 2013-08-05 DIAGNOSIS — I10 Essential (primary) hypertension: Secondary | ICD-10-CM

## 2013-08-05 DIAGNOSIS — H43819 Vitreous degeneration, unspecified eye: Secondary | ICD-10-CM

## 2013-08-05 DIAGNOSIS — H2510 Age-related nuclear cataract, unspecified eye: Secondary | ICD-10-CM

## 2013-08-05 DIAGNOSIS — H3530 Unspecified macular degeneration: Secondary | ICD-10-CM

## 2013-08-05 DIAGNOSIS — H35039 Hypertensive retinopathy, unspecified eye: Secondary | ICD-10-CM

## 2013-08-05 DIAGNOSIS — H353290 Exudative age-related macular degeneration, unspecified eye, stage unspecified: Secondary | ICD-10-CM

## 2013-09-02 ENCOUNTER — Encounter (INDEPENDENT_AMBULATORY_CARE_PROVIDER_SITE_OTHER): Payer: Medicare Other | Admitting: Ophthalmology

## 2013-09-02 DIAGNOSIS — H353290 Exudative age-related macular degeneration, unspecified eye, stage unspecified: Secondary | ICD-10-CM

## 2013-09-02 DIAGNOSIS — I10 Essential (primary) hypertension: Secondary | ICD-10-CM

## 2013-09-02 DIAGNOSIS — H43819 Vitreous degeneration, unspecified eye: Secondary | ICD-10-CM

## 2013-09-02 DIAGNOSIS — H35039 Hypertensive retinopathy, unspecified eye: Secondary | ICD-10-CM

## 2013-09-30 ENCOUNTER — Encounter (INDEPENDENT_AMBULATORY_CARE_PROVIDER_SITE_OTHER): Payer: Medicare Other | Admitting: Ophthalmology

## 2013-09-30 DIAGNOSIS — H3530 Unspecified macular degeneration: Secondary | ICD-10-CM

## 2013-09-30 DIAGNOSIS — I10 Essential (primary) hypertension: Secondary | ICD-10-CM

## 2013-09-30 DIAGNOSIS — H353290 Exudative age-related macular degeneration, unspecified eye, stage unspecified: Secondary | ICD-10-CM

## 2013-09-30 DIAGNOSIS — H2510 Age-related nuclear cataract, unspecified eye: Secondary | ICD-10-CM

## 2013-09-30 DIAGNOSIS — H35039 Hypertensive retinopathy, unspecified eye: Secondary | ICD-10-CM

## 2013-09-30 DIAGNOSIS — H43819 Vitreous degeneration, unspecified eye: Secondary | ICD-10-CM

## 2013-10-27 ENCOUNTER — Encounter (INDEPENDENT_AMBULATORY_CARE_PROVIDER_SITE_OTHER): Payer: Medicare Other | Admitting: Ophthalmology

## 2013-10-27 DIAGNOSIS — H353290 Exudative age-related macular degeneration, unspecified eye, stage unspecified: Secondary | ICD-10-CM

## 2013-10-27 DIAGNOSIS — H35039 Hypertensive retinopathy, unspecified eye: Secondary | ICD-10-CM

## 2013-10-27 DIAGNOSIS — H2510 Age-related nuclear cataract, unspecified eye: Secondary | ICD-10-CM

## 2013-10-27 DIAGNOSIS — H43819 Vitreous degeneration, unspecified eye: Secondary | ICD-10-CM

## 2013-10-27 DIAGNOSIS — H3530 Unspecified macular degeneration: Secondary | ICD-10-CM

## 2013-10-27 DIAGNOSIS — I10 Essential (primary) hypertension: Secondary | ICD-10-CM

## 2013-11-23 ENCOUNTER — Encounter (INDEPENDENT_AMBULATORY_CARE_PROVIDER_SITE_OTHER): Payer: Medicare Other | Admitting: Ophthalmology

## 2013-11-23 DIAGNOSIS — H35039 Hypertensive retinopathy, unspecified eye: Secondary | ICD-10-CM

## 2013-11-23 DIAGNOSIS — H43819 Vitreous degeneration, unspecified eye: Secondary | ICD-10-CM

## 2013-11-23 DIAGNOSIS — H3530 Unspecified macular degeneration: Secondary | ICD-10-CM

## 2013-11-23 DIAGNOSIS — H353290 Exudative age-related macular degeneration, unspecified eye, stage unspecified: Secondary | ICD-10-CM

## 2013-11-23 DIAGNOSIS — I10 Essential (primary) hypertension: Secondary | ICD-10-CM

## 2013-11-23 DIAGNOSIS — H2510 Age-related nuclear cataract, unspecified eye: Secondary | ICD-10-CM

## 2013-12-21 ENCOUNTER — Encounter (INDEPENDENT_AMBULATORY_CARE_PROVIDER_SITE_OTHER): Payer: Medicare Other | Admitting: Ophthalmology

## 2013-12-21 DIAGNOSIS — H3530 Unspecified macular degeneration: Secondary | ICD-10-CM

## 2013-12-21 DIAGNOSIS — H353290 Exudative age-related macular degeneration, unspecified eye, stage unspecified: Secondary | ICD-10-CM

## 2013-12-21 DIAGNOSIS — H43819 Vitreous degeneration, unspecified eye: Secondary | ICD-10-CM

## 2013-12-21 DIAGNOSIS — H2510 Age-related nuclear cataract, unspecified eye: Secondary | ICD-10-CM

## 2013-12-21 DIAGNOSIS — H35039 Hypertensive retinopathy, unspecified eye: Secondary | ICD-10-CM

## 2013-12-21 DIAGNOSIS — I10 Essential (primary) hypertension: Secondary | ICD-10-CM

## 2014-01-17 ENCOUNTER — Encounter (INDEPENDENT_AMBULATORY_CARE_PROVIDER_SITE_OTHER): Payer: Medicare Other | Admitting: Ophthalmology

## 2014-01-17 DIAGNOSIS — H3530 Unspecified macular degeneration: Secondary | ICD-10-CM

## 2014-01-17 DIAGNOSIS — H35039 Hypertensive retinopathy, unspecified eye: Secondary | ICD-10-CM

## 2014-01-17 DIAGNOSIS — I10 Essential (primary) hypertension: Secondary | ICD-10-CM

## 2014-01-17 DIAGNOSIS — H353290 Exudative age-related macular degeneration, unspecified eye, stage unspecified: Secondary | ICD-10-CM

## 2014-01-17 DIAGNOSIS — H43819 Vitreous degeneration, unspecified eye: Secondary | ICD-10-CM

## 2014-01-18 ENCOUNTER — Encounter (INDEPENDENT_AMBULATORY_CARE_PROVIDER_SITE_OTHER): Payer: Medicare Other | Admitting: Ophthalmology

## 2014-02-15 ENCOUNTER — Encounter (INDEPENDENT_AMBULATORY_CARE_PROVIDER_SITE_OTHER): Payer: Medicare Other | Admitting: Ophthalmology

## 2014-02-15 DIAGNOSIS — I10 Essential (primary) hypertension: Secondary | ICD-10-CM

## 2014-02-15 DIAGNOSIS — H35039 Hypertensive retinopathy, unspecified eye: Secondary | ICD-10-CM

## 2014-02-15 DIAGNOSIS — H43819 Vitreous degeneration, unspecified eye: Secondary | ICD-10-CM

## 2014-02-15 DIAGNOSIS — H353290 Exudative age-related macular degeneration, unspecified eye, stage unspecified: Secondary | ICD-10-CM

## 2014-02-15 DIAGNOSIS — H3530 Unspecified macular degeneration: Secondary | ICD-10-CM

## 2014-03-14 ENCOUNTER — Encounter (INDEPENDENT_AMBULATORY_CARE_PROVIDER_SITE_OTHER): Payer: Medicare Other | Admitting: Ophthalmology

## 2014-03-14 DIAGNOSIS — H353290 Exudative age-related macular degeneration, unspecified eye, stage unspecified: Secondary | ICD-10-CM

## 2014-03-14 DIAGNOSIS — H35039 Hypertensive retinopathy, unspecified eye: Secondary | ICD-10-CM

## 2014-03-14 DIAGNOSIS — H2510 Age-related nuclear cataract, unspecified eye: Secondary | ICD-10-CM

## 2014-03-14 DIAGNOSIS — H3530 Unspecified macular degeneration: Secondary | ICD-10-CM

## 2014-03-14 DIAGNOSIS — H43819 Vitreous degeneration, unspecified eye: Secondary | ICD-10-CM

## 2014-03-14 DIAGNOSIS — I10 Essential (primary) hypertension: Secondary | ICD-10-CM

## 2014-04-11 ENCOUNTER — Encounter (INDEPENDENT_AMBULATORY_CARE_PROVIDER_SITE_OTHER): Payer: Medicare Other | Admitting: Ophthalmology

## 2014-04-11 DIAGNOSIS — H353290 Exudative age-related macular degeneration, unspecified eye, stage unspecified: Secondary | ICD-10-CM

## 2014-04-11 DIAGNOSIS — I10 Essential (primary) hypertension: Secondary | ICD-10-CM

## 2014-04-11 DIAGNOSIS — H35039 Hypertensive retinopathy, unspecified eye: Secondary | ICD-10-CM

## 2014-04-11 DIAGNOSIS — H3530 Unspecified macular degeneration: Secondary | ICD-10-CM

## 2014-04-11 DIAGNOSIS — H2510 Age-related nuclear cataract, unspecified eye: Secondary | ICD-10-CM

## 2014-04-11 DIAGNOSIS — H43819 Vitreous degeneration, unspecified eye: Secondary | ICD-10-CM

## 2014-05-09 ENCOUNTER — Encounter (INDEPENDENT_AMBULATORY_CARE_PROVIDER_SITE_OTHER): Payer: Medicare Other | Admitting: Ophthalmology

## 2014-05-09 DIAGNOSIS — H353290 Exudative age-related macular degeneration, unspecified eye, stage unspecified: Secondary | ICD-10-CM

## 2014-05-09 DIAGNOSIS — H3530 Unspecified macular degeneration: Secondary | ICD-10-CM

## 2014-05-09 DIAGNOSIS — H2510 Age-related nuclear cataract, unspecified eye: Secondary | ICD-10-CM

## 2014-05-09 DIAGNOSIS — I10 Essential (primary) hypertension: Secondary | ICD-10-CM

## 2014-05-09 DIAGNOSIS — H35039 Hypertensive retinopathy, unspecified eye: Secondary | ICD-10-CM

## 2014-05-09 DIAGNOSIS — H43819 Vitreous degeneration, unspecified eye: Secondary | ICD-10-CM

## 2014-06-06 ENCOUNTER — Encounter (INDEPENDENT_AMBULATORY_CARE_PROVIDER_SITE_OTHER): Payer: Medicare Other | Admitting: Ophthalmology

## 2014-06-06 DIAGNOSIS — H353290 Exudative age-related macular degeneration, unspecified eye, stage unspecified: Secondary | ICD-10-CM

## 2014-06-06 DIAGNOSIS — H35039 Hypertensive retinopathy, unspecified eye: Secondary | ICD-10-CM

## 2014-06-06 DIAGNOSIS — H43819 Vitreous degeneration, unspecified eye: Secondary | ICD-10-CM

## 2014-06-06 DIAGNOSIS — H3530 Unspecified macular degeneration: Secondary | ICD-10-CM

## 2014-06-06 DIAGNOSIS — H2510 Age-related nuclear cataract, unspecified eye: Secondary | ICD-10-CM

## 2014-06-06 DIAGNOSIS — I10 Essential (primary) hypertension: Secondary | ICD-10-CM

## 2014-07-04 ENCOUNTER — Encounter (INDEPENDENT_AMBULATORY_CARE_PROVIDER_SITE_OTHER): Payer: Medicare Other | Admitting: Ophthalmology

## 2014-07-04 DIAGNOSIS — I10 Essential (primary) hypertension: Secondary | ICD-10-CM

## 2014-07-04 DIAGNOSIS — H2510 Age-related nuclear cataract, unspecified eye: Secondary | ICD-10-CM

## 2014-07-04 DIAGNOSIS — H353290 Exudative age-related macular degeneration, unspecified eye, stage unspecified: Secondary | ICD-10-CM

## 2014-07-04 DIAGNOSIS — H3530 Unspecified macular degeneration: Secondary | ICD-10-CM

## 2014-07-04 DIAGNOSIS — H43819 Vitreous degeneration, unspecified eye: Secondary | ICD-10-CM

## 2014-07-04 DIAGNOSIS — H35039 Hypertensive retinopathy, unspecified eye: Secondary | ICD-10-CM

## 2014-08-03 ENCOUNTER — Encounter (INDEPENDENT_AMBULATORY_CARE_PROVIDER_SITE_OTHER): Payer: Medicare Other | Admitting: Ophthalmology

## 2014-08-03 DIAGNOSIS — H35039 Hypertensive retinopathy, unspecified eye: Secondary | ICD-10-CM

## 2014-08-03 DIAGNOSIS — H353290 Exudative age-related macular degeneration, unspecified eye, stage unspecified: Secondary | ICD-10-CM

## 2014-08-03 DIAGNOSIS — I10 Essential (primary) hypertension: Secondary | ICD-10-CM

## 2014-08-03 DIAGNOSIS — H3530 Unspecified macular degeneration: Secondary | ICD-10-CM

## 2014-08-03 DIAGNOSIS — H2510 Age-related nuclear cataract, unspecified eye: Secondary | ICD-10-CM

## 2014-08-03 DIAGNOSIS — H43819 Vitreous degeneration, unspecified eye: Secondary | ICD-10-CM

## 2014-09-06 ENCOUNTER — Encounter (INDEPENDENT_AMBULATORY_CARE_PROVIDER_SITE_OTHER): Payer: Medicare Other | Admitting: Ophthalmology

## 2014-09-06 DIAGNOSIS — H35033 Hypertensive retinopathy, bilateral: Secondary | ICD-10-CM

## 2014-09-06 DIAGNOSIS — H3531 Nonexudative age-related macular degeneration: Secondary | ICD-10-CM

## 2014-09-06 DIAGNOSIS — H43813 Vitreous degeneration, bilateral: Secondary | ICD-10-CM

## 2014-09-06 DIAGNOSIS — H3532 Exudative age-related macular degeneration: Secondary | ICD-10-CM

## 2014-10-10 ENCOUNTER — Ambulatory Visit
Admission: RE | Admit: 2014-10-10 | Discharge: 2014-10-10 | Disposition: A | Discharge status: Home / Self Care | Payer: Medicare Other | Source: Ambulatory Visit | Attending: Orthopedic Surgery | Admitting: Orthopedic Surgery

## 2014-10-10 ENCOUNTER — Other Ambulatory Visit: Payer: Self-pay | Admitting: Orthopedic Surgery

## 2014-10-10 DIAGNOSIS — M1990 Unspecified osteoarthritis, unspecified site: Secondary | ICD-10-CM

## 2014-10-10 DIAGNOSIS — M25512 Pain in left shoulder: Secondary | ICD-10-CM

## 2014-10-10 DIAGNOSIS — M25511 Pain in right shoulder: Secondary | ICD-10-CM

## 2014-10-10 IMAGING — CT CT SHOULDER*R* W/O CM
3 of 4 series · 7 of 14 positions shown, 8 images · non-contrast
Comparison: Chest CT [DATE].

CLINICAL DATA: Bilateral shoulder pain with decreased range of
motion, worse on the left. History of bilateral shoulder
arthroscopic surgery. Preoperative evaluation for shoulder surgery.
Initial encounter.

EXAM:
CT OF THE RIGHT SHOULDER WITHOUT CONTRAST
TECHNIQUE: Multidetector CT imaging was performed according to the standard
protocol. Multiplanar CT image reconstructions were also generated.

[Series 3: rt shoulder /std · axial · 0.47mm/px · z∈[-168,-105]mm · 2 of 77 slices shown]
[im 26/77  soft-tissue]
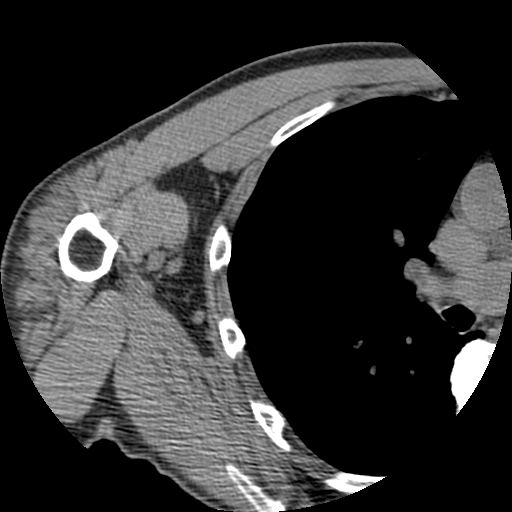
[im 51/77  soft-tissue]
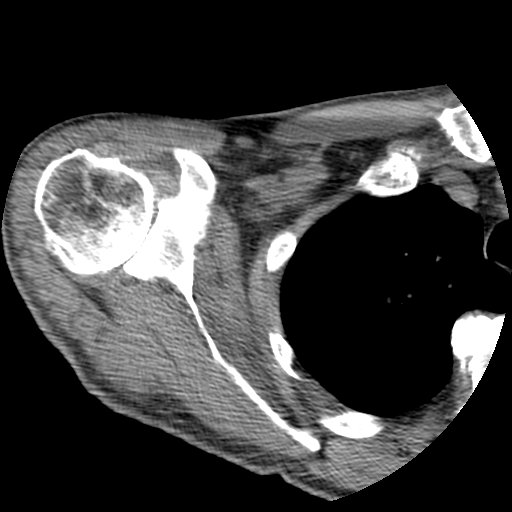

[Series 104: cor bone · axial · 0.47mm/px · z∈[-113,+7]mm · 3 of 94 slices shown, 4 images]
[im 1/94  soft-tissue]
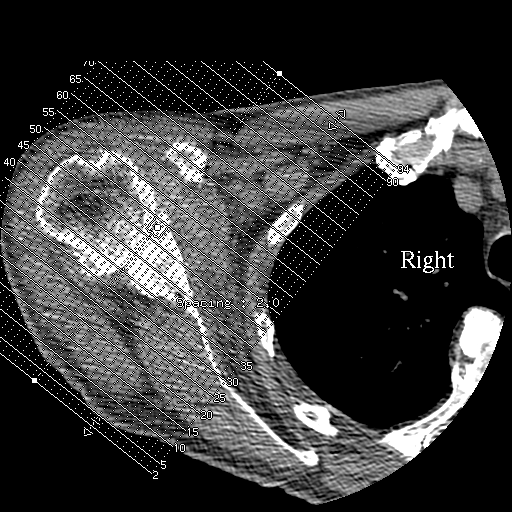
[im 1/94  bone]
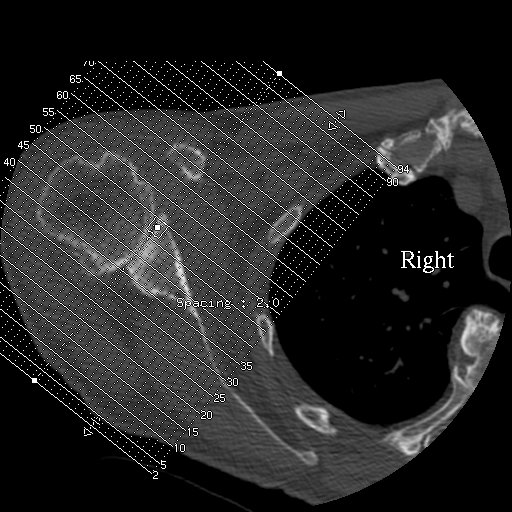
[im 47/94  bone]
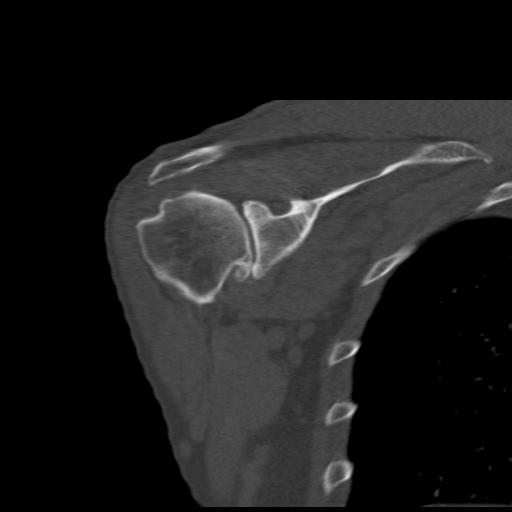
[im 94/94  bone]
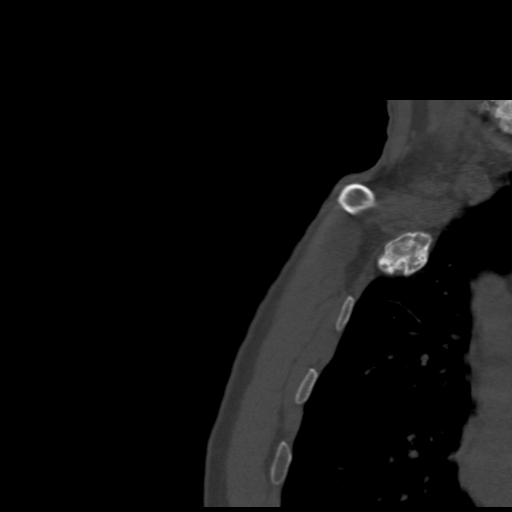

[Series 105: sag soft · oblique · 0.47mm/px · 2 of 122 slices shown]
[im 41/122  soft-tissue]
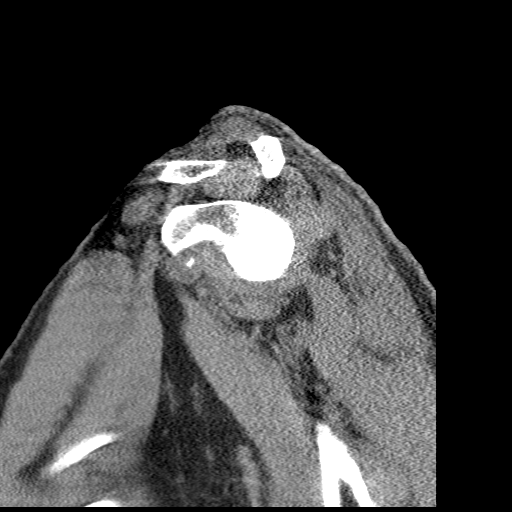
[im 81/122  soft-tissue]
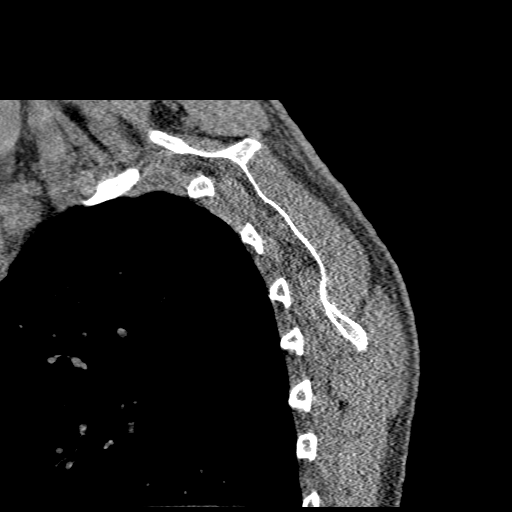

[7 of 14 positions shown; findings below may reference images not displayed]

FINDINGS: There are moderate glenohumeral degenerative changes on the right
with joint space loss and osteophytes. There are probable small
loose bodies within the superior subscapularis recess and superior
aspect of the bicipital groove. There is a moderate-sized joint
effusion.

The acromion is type 1. There are mild acromioclavicular
degenerative changes. There is some fatty atrophy of the teres minor
muscle. The additional components of the rotator cuff demonstrate no
significant atrophy. There appears to be mild deltoid atrophy
laterally.

The visualized right chest wall and right lung appear unremarkable.
Postsurgical changes status post lower cervical fusion are noted.
IMPRESSION: Moderate glenohumeral degenerative changes on the right with small
loose bodies. Atrophy of the teres minor and deltoid musculature.

## 2014-10-10 IMAGING — CT CT SHOULDER*L* W/O CM
3 series · 8 of 14 positions shown, 9 images · non-contrast
Comparison: Chest CT [DATE].

CLINICAL DATA: Bilateral shoulder pain with decreased range of
motion, worse on the left. History of bilateral shoulder
arthroscopic surgery. Preoperative evaluation for shoulder surgery.
Initial encounter.

EXAM:
CT OF THE LEFT SHOULDER WITHOUT CONTRAST
TECHNIQUE: Multidetector CT imaging was performed according to the standard
protocol. Multiplanar CT image reconstructions were also generated.

[Series 3: left shoulder/standard · axial · 0.47mm/px · z∈[-184,-114]mm · 2 of 86 slices shown, 3 images]
[im 29/86  soft-tissue]
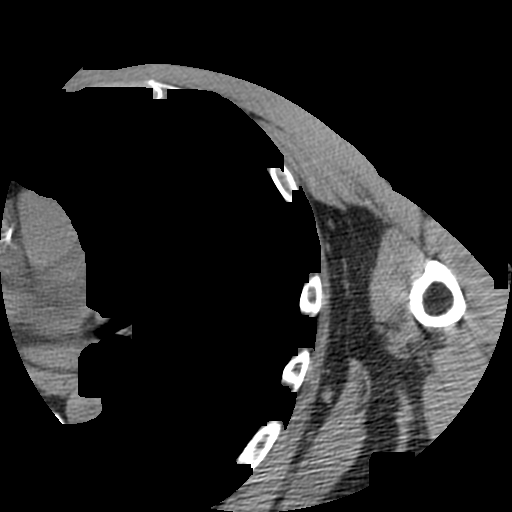
[im 29/86  bone]
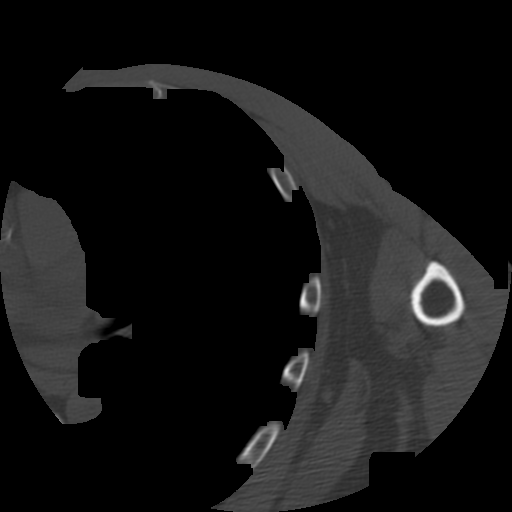
[im 57/86  bone]
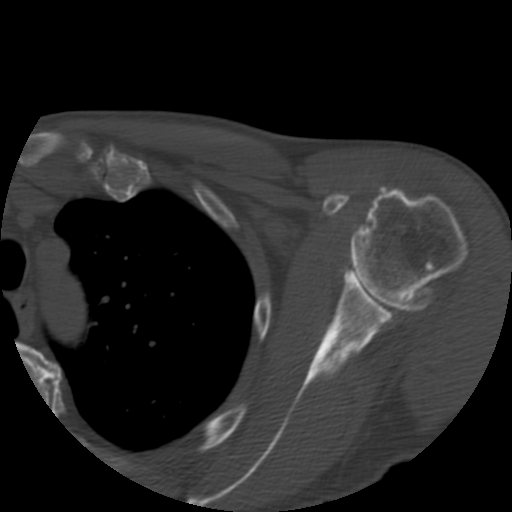

[Series 103: cor soft · oblique · 0.47mm/px · 3 of 110 slices shown]
[im 28/110  soft-tissue]
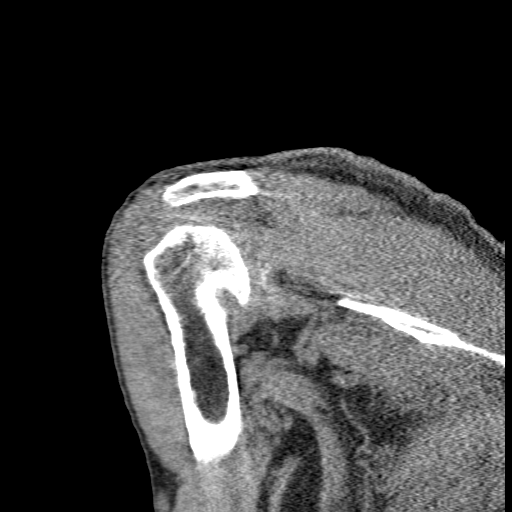
[im 55/110  soft-tissue]
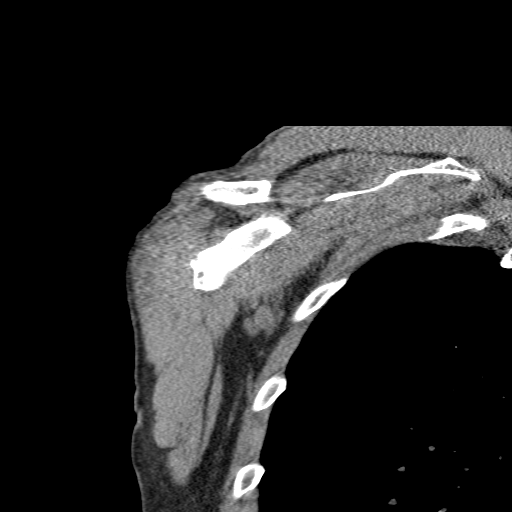
[im 82/110  soft-tissue]
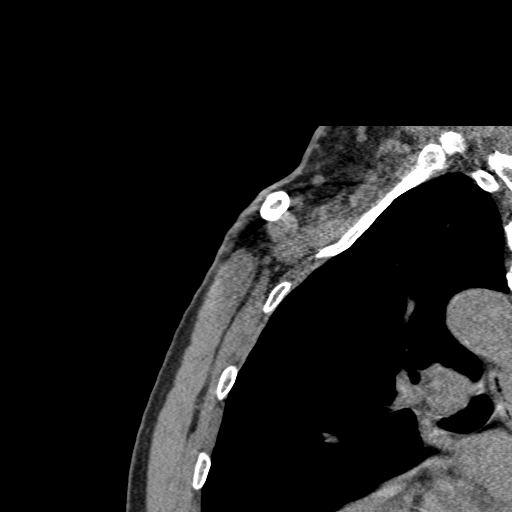

[Series 104: sag soft · oblique · 0.47mm/px · 3 of 120 slices shown]
[im 30/120  soft-tissue]
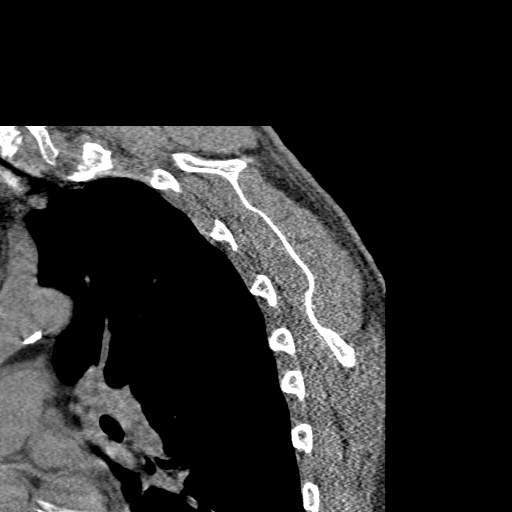
[im 60/120  soft-tissue]
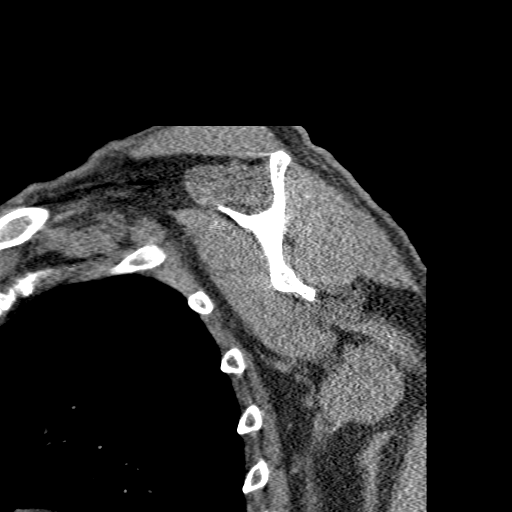
[im 90/120  soft-tissue]
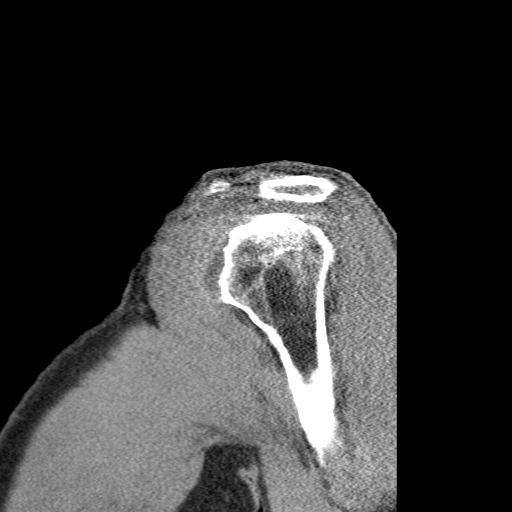

[8 of 14 positions shown; findings below may reference images not displayed]

FINDINGS: There are moderately advanced glenohumeral degenerative changes with
joint space loss, osteophytes and subchondral sclerosis. Several
small loose bodies are present within the joint. Amorphous
calcification anterior to the glenoid may represent
chondrocalcinosis within displaced labral tissue.

The acromion is type 1 with mild lateral downsloping. There is mild
widening of the acromioclavicular joint which may be secondary to
previous distal clavicle resection. The subacromial space appears
only minimally narrowed. There is no significant focal atrophy of
the rotator cuff musculature.

The visualize left chest wall and left lung appear unremarkable
there is atherosclerosis of the aorta and coronary arteries. Mitral
annular calcifications and previous cervical fusion are noted.
IMPRESSION: Moderately advanced glenohumeral degenerative changes on the left
with prominent osteophytes and small loose bodies.

## 2014-10-11 ENCOUNTER — Encounter (INDEPENDENT_AMBULATORY_CARE_PROVIDER_SITE_OTHER): Payer: Medicare Other | Admitting: Ophthalmology

## 2014-10-11 DIAGNOSIS — H3532 Exudative age-related macular degeneration: Secondary | ICD-10-CM

## 2014-10-11 DIAGNOSIS — I10 Essential (primary) hypertension: Secondary | ICD-10-CM

## 2014-10-11 DIAGNOSIS — H35033 Hypertensive retinopathy, bilateral: Secondary | ICD-10-CM

## 2014-10-11 DIAGNOSIS — H43813 Vitreous degeneration, bilateral: Secondary | ICD-10-CM

## 2014-10-11 DIAGNOSIS — H3531 Nonexudative age-related macular degeneration: Secondary | ICD-10-CM

## 2014-10-11 DIAGNOSIS — H2513 Age-related nuclear cataract, bilateral: Secondary | ICD-10-CM

## 2014-10-19 NOTE — Pre-Procedure Instructions (Signed)
Mitchell Harris  10/19/2014   Your procedure is scheduled on: Tuesday, November 01, 2014  [facility] Ad[facility] at 8:45 AM.  Call this number if you have problems the morning of surgery: [phone]   Remember:   Do not eat food or drink liquids after midnight Monday, October 31, 2014   Take these medicines the morning of surgery with A SIP OF WATER: amLODipine (NORVASC), tamsulosin [facility](FLOMAX) Stop taking Aspirin, vitamins, and herbal medications. Do not take any NSAIDs ie: Ibuprofen, Advil, Naproxen or any medication containing Aspirin; stop 5 days prior to procedure (Thursday, October 27, 2014).   Do not wear jewelry  Do not wear lotions, powders, or perfumes. You may not wear deodorant.           . Men may shave face and neck.  Do not bring valuables to the hospital.  [facility] is not responsible for any belongings or valuables.               Contacts, dentures or bridgework may not be worn into surgery.  Leave suitcase in the car. After surgery it may be brought to your room.  For patients admitted to the hospital, discharge time is determined by your treatment team.               Patients discharged the day of surgery will not be allowed to drive home.  Name and phone number of your driver:   Special Instructions:  Special Instructions:Special Instructions: [facility] - Preparing for Surgery  Before surgery, you can play an important role.  Because skin is not sterile, your skin needs to be as free of germs as possible.  You can reduce the number of germs on you skin by washing with CHG (chlorahexidine gluconate) soap before surgery.  CHG is an antiseptic cleaner which kills germs and bonds with the skin to continue killing germs even after washing.  Please DO NOT use if you have an allergy to CHG or antibacterial soaps.  If your skin becomes reddened/irritated stop using the CHG and inform your nurse when you arrive at Short Stay.  Do not shave (including  legs and underarms) for at least 48 hours prior to the first CHG shower.  You may shave your face.  Please follow these instructions carefully:   1.  Shower with CHG Soap the night before surgery and the morning of Surgery.  2.  If you choose to wash your hair, wash your hair first as usual with your normal shampoo.  3.  After you shampoo, rinse your hair and body thoroughly to remove the Shampoo.  4.  Use CHG as you would any other liquid soap.  You can apply chg directly  to the skin and wash gently with scrungie or a clean washcloth.  5.  Apply the CHG Soap to your body ONLY FROM THE NECK DOWN.  Do not use on open wounds or open sores.  Avoid contact with your eyes, ears, mouth and genitals (private parts).  Wash genitals (private parts) with your normal soap.  6.  Wash thoroughly, paying special attention to the area where your surgery will be performed.  7.  Thoroughly rinse your body with warm water from the neck down.  8.  DO NOT shower/wash with your normal soap after using and rinsing off the CHG Soap.  9.  Pat yourself dry with a clean towel.            10.  Wear clean pajamas.            11.  Place clean sheets on your bed the night of your first shower and do not sleep with pets.  Day of Surgery  Do not apply any lotions/deodorants the morning of surgery.  Please wear clean clothes to the hospital/surgery center.   Please read over the following fact sheets that you were given: Pain Booklet, Coughing and Deep Breathing, Blood Transfusion Information and Surgical Site Infection Prevention

## 2014-10-20 ENCOUNTER — Encounter (HOSPITAL_COMMUNITY)
Admission: RE | Admit: 2014-10-20 | Discharge: 2014-10-20 | Disposition: A | Discharge status: Home / Self Care | Payer: Medicare Other | Source: Ambulatory Visit | Attending: Orthopedic Surgery | Admitting: Orthopedic Surgery

## 2014-10-20 ENCOUNTER — Encounter (HOSPITAL_COMMUNITY): Payer: Self-pay

## 2014-10-20 DIAGNOSIS — Z01818 Encounter for other preprocedural examination: Secondary | ICD-10-CM | POA: Diagnosis present

## 2014-10-20 HISTORY — DX: Other complications of anesthesia, initial encounter: T88.59XA

## 2014-10-20 HISTORY — DX: Essential (primary) hypertension: I10

## 2014-10-20 HISTORY — DX: Myoneural disorder, unspecified: G70.9

## 2014-10-20 HISTORY — DX: Depression, unspecified: F32.A

## 2014-10-20 HISTORY — DX: Adverse effect of unspecified anesthetic, initial encounter: T41.45XA

## 2014-10-20 HISTORY — DX: Family history of other specified conditions: Z84.89

## 2014-10-20 HISTORY — DX: Nausea with vomiting, unspecified: R11.2

## 2014-10-20 HISTORY — DX: Personal history of other medical treatment: Z92.89

## 2014-10-20 HISTORY — DX: Major depressive disorder, single episode, unspecified: F32.9

## 2014-10-20 HISTORY — DX: Other specified postprocedural states: Z98.890

## 2014-10-20 HISTORY — DX: Unspecified macular degeneration: H35.30

## 2014-10-20 HISTORY — DX: Unspecified osteoarthritis, unspecified site: M19.90

## 2014-10-20 LAB — CBC
HCT: 39 % (ref 39.0–52.0)
Hemoglobin: 12.8 g/dL — ABNORMAL LOW (ref 13.0–17.0)
MCH: 29.6 pg (ref 26.0–34.0)
MCHC: 32.8 g/dL (ref 30.0–36.0)
MCV: 90.1 fL (ref 78.0–100.0)
PLATELETS: 261 10*3/uL (ref 150–400)
RBC: 4.33 MIL/uL (ref 4.22–5.81)
RDW: 12.5 % (ref 11.5–15.5)
WBC: 7.5 10*3/uL (ref 4.0–10.5)

## 2014-10-20 LAB — BASIC METABOLIC PANEL
Anion gap: 16 — ABNORMAL HIGH (ref 5–15)
BUN: 22 mg/dL (ref 6–23)
CALCIUM: 9.6 mg/dL (ref 8.4–10.5)
CO2: 19 mEq/L (ref 19–32)
CREATININE: 0.78 mg/dL (ref 0.50–1.35)
Chloride: 106 mEq/L (ref 96–112)
GFR, EST NON AFRICAN AMERICAN: 89 mL/min — AB (ref >90)
Glucose, Bld: 111 mg/dL — ABNORMAL HIGH (ref 70–99)
Potassium: 4.7 mEq/L (ref 3.7–5.3)
Sodium: 141 mEq/L (ref 137–147)

## 2014-10-20 LAB — TYPE AND SCREEN
ABO/RH(D): A POS
Antibody Screen: NEGATIVE

## 2014-10-20 NOTE — Progress Notes (Signed)
PCP- Tamsen Roers in [place], manages [facility]. Never has met Manufacturing engineer.

## 2014-10-31 MED ORDER — CEFAZOLIN SODIUM-DEXTROSE 2-3 GM-% IV SOLR
2.0000 g | INTRAVENOUS | Status: AC
  Administered 2014-11-01: 2 g via INTRAVENOUS
  Filled 2014-10-31: qty 50

## 2014-11-01 ENCOUNTER — Encounter (HOSPITAL_COMMUNITY): Payer: Self-pay | Admitting: Anesthesiology

## 2014-11-01 ENCOUNTER — Encounter (HOSPITAL_COMMUNITY)
Admission: RE | Disposition: A | Discharge status: Home / Self Care | Payer: Self-pay | Source: Ambulatory Visit | Attending: Orthopedic Surgery

## 2014-11-01 ENCOUNTER — Inpatient Hospital Stay (HOSPITAL_COMMUNITY)
Admission: RE | Admit: 2014-11-01 | Discharge: 2014-11-02 | DRG: 483 | Disposition: A | Discharge status: Home / Self Care | Payer: Medicare Other | Source: Ambulatory Visit | Attending: Orthopedic Surgery | Admitting: Orthopedic Surgery

## 2014-11-01 ENCOUNTER — Inpatient Hospital Stay (HOSPITAL_COMMUNITY): Payer: Medicare Other | Admitting: Anesthesiology

## 2014-11-01 ENCOUNTER — Inpatient Hospital Stay (HOSPITAL_COMMUNITY): Payer: Medicare Other

## 2014-11-01 DIAGNOSIS — M25512 Pain in left shoulder: Secondary | ICD-10-CM | POA: Diagnosis present

## 2014-11-01 DIAGNOSIS — F329 Major depressive disorder, single episode, unspecified: Secondary | ICD-10-CM | POA: Diagnosis present

## 2014-11-01 DIAGNOSIS — Z96612 Presence of left artificial shoulder joint: Secondary | ICD-10-CM

## 2014-11-01 DIAGNOSIS — I10 Essential (primary) hypertension: Secondary | ICD-10-CM | POA: Diagnosis present

## 2014-11-01 DIAGNOSIS — H3530 Unspecified macular degeneration: Secondary | ICD-10-CM | POA: Diagnosis present

## 2014-11-01 DIAGNOSIS — M19012 Primary osteoarthritis, left shoulder: Principal | ICD-10-CM | POA: Diagnosis present

## 2014-11-01 HISTORY — DX: Primary osteoarthritis, left shoulder: M19.012

## 2014-11-01 HISTORY — PX: TOTAL SHOULDER ARTHROPLASTY: SHX126

## 2014-11-01 IMAGING — CR DG SHOULDER 2+V*L*
1 series · 1 of 1 positions shown · non-contrast
Comparison: CT left shoulder [DATE]

CLINICAL DATA: Status post left reverse total shoulder
arthroplasty.

EXAM:
LEFT SHOULDER - 2+ VIEW

[AP]
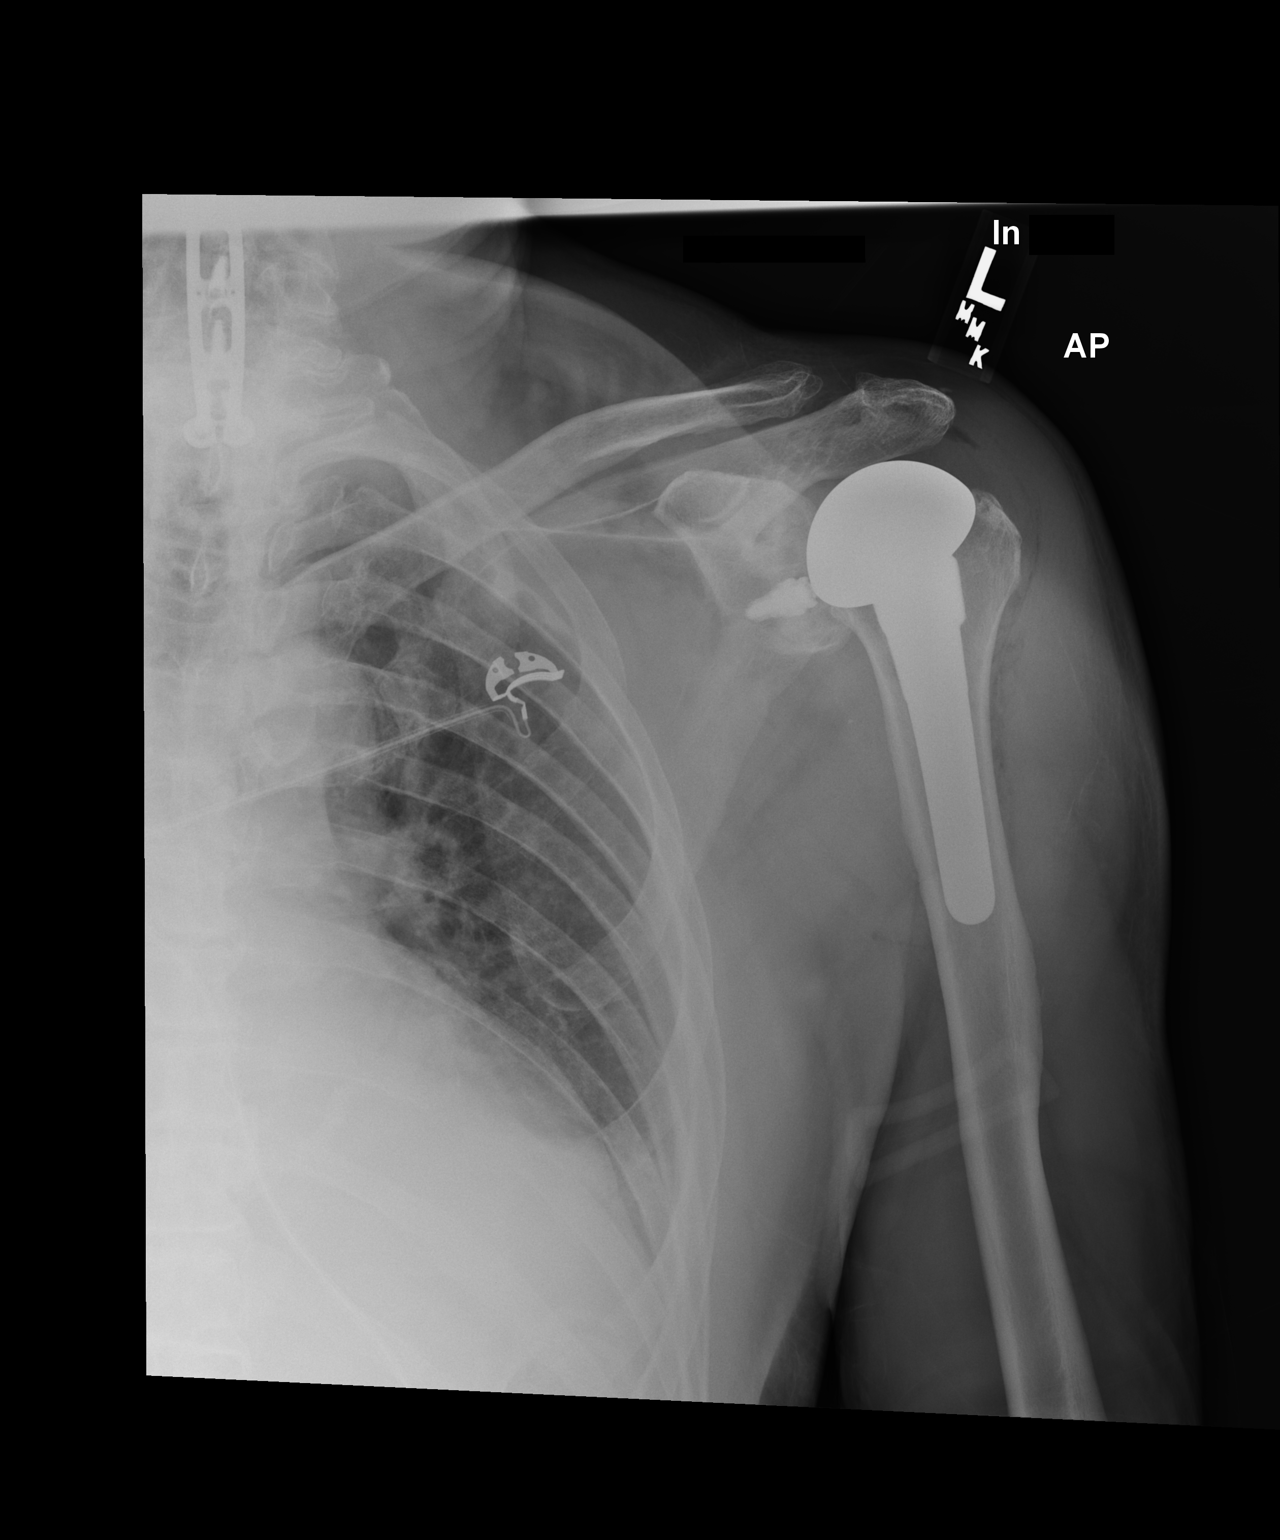

[1 of 1 positions shown; findings below may reference images not displayed]

FINDINGS: Sequelae of interval left reverse total shoulder arthroplasty are
identified. The prosthetic components appear located on this single
projection. Postoperative gas is noted in the surrounding soft
tissues. No acute fracture is identified.
IMPRESSION: Interval left total shoulder arthroplasty.

## 2014-11-01 SURGERY — ARTHROPLASTY, SHOULDER, TOTAL
Anesthesia: Regional | Laterality: Left

## 2014-11-01 MED ORDER — BISACODYL 10 MG RE SUPP: 10.0000 mg | Freq: Every day | RECTAL | Status: DC | PRN

## 2014-11-01 MED ORDER — DOCUSATE SODIUM 100 MG PO CAPS
100.0000 mg | ORAL_CAPSULE | Freq: Two times a day (BID) | ORAL | Status: DC
  Administered 2014-11-01 – 2014-11-02 (×2): 100 mg via ORAL
  Filled 2014-11-01 (×2): qty 1

## 2014-11-01 MED ORDER — ATORVASTATIN CALCIUM 10 MG PO TABS
10.0000 mg | ORAL_TABLET | Freq: Every day | ORAL | Status: DC
  Administered 2014-11-01: 10 mg via ORAL
  Filled 2014-11-01 (×2): qty 1

## 2014-11-01 MED ORDER — PROPOFOL 10 MG/ML IV BOLUS
INTRAVENOUS | Status: AC
  Filled 2014-11-01: qty 20

## 2014-11-01 MED ORDER — FENTANYL CITRATE 0.05 MG/ML IJ SOLN
INTRAMUSCULAR | Status: DC | PRN
  Administered 2014-11-01: 50 ug via INTRAVENOUS
  Administered 2014-11-01: 100 ug via INTRAVENOUS

## 2014-11-01 MED ORDER — 0.9 % SODIUM CHLORIDE (POUR BTL) OPTIME
TOPICAL | Status: DC | PRN
  Administered 2014-11-01: 1000 mL

## 2014-11-01 MED ORDER — DEXTROSE 5 % IV SOLN
500.0000 mg | Freq: Four times a day (QID) | INTRAVENOUS | Status: DC | PRN
  Filled 2014-11-01: qty 5

## 2014-11-01 MED ORDER — LACTATED RINGERS IV SOLN
INTRAVENOUS | Status: DC
  Administered 2014-11-01: 10:00:00 via INTRAVENOUS

## 2014-11-01 MED ORDER — PHENYLEPHRINE HCL 10 MG/ML IJ SOLN
10.0000 mg | INTRAMUSCULAR | Status: DC | PRN
  Administered 2014-11-01: 15 ug/min via INTRAVENOUS

## 2014-11-01 MED ORDER — OXYCODONE HCL 5 MG PO TABS: 5.0000 mg | ORAL_TABLET | Freq: Once | ORAL | Status: DC | PRN

## 2014-11-01 MED ORDER — METOCLOPRAMIDE HCL 5 MG/ML IJ SOLN: 5.0000 mg | Freq: Three times a day (TID) | INTRAMUSCULAR | Status: DC | PRN

## 2014-11-01 MED ORDER — ACETAMINOPHEN 650 MG RE SUPP
650.0000 mg | Freq: Four times a day (QID) | RECTAL | Status: DC | PRN
Start: 2014-11-01 — End: 2014-11-02

## 2014-11-01 MED ORDER — BACLOFEN 10 MG PO TABS
10.0000 mg | ORAL_TABLET | Freq: Three times a day (TID) | ORAL | Status: DC
Start: 2014-11-01 — End: 2015-02-14

## 2014-11-01 MED ORDER — ZOLPIDEM TARTRATE 5 MG PO TABS
5.0000 mg | ORAL_TABLET | Freq: Every day | ORAL | Status: DC
  Administered 2014-11-01: 5 mg via ORAL
  Filled 2014-11-01: qty 1

## 2014-11-01 MED ORDER — SENNA 8.6 MG PO TABS
1.0000 | ORAL_TABLET | Freq: Two times a day (BID) | ORAL | Status: DC
  Administered 2014-11-01 – 2014-11-02 (×2): 8.6 mg via ORAL
  Filled 2014-11-01 (×3): qty 1

## 2014-11-01 MED ORDER — LOSARTAN POTASSIUM 50 MG PO TABS
100.0000 mg | ORAL_TABLET | Freq: Every day | ORAL | Status: DC
  Administered 2014-11-01: 100 mg via ORAL
  Filled 2014-11-01 (×2): qty 2

## 2014-11-01 MED ORDER — PROPOFOL 10 MG/ML IV BOLUS
INTRAVENOUS | Status: DC | PRN
  Administered 2014-11-01: 150 mg via INTRAVENOUS

## 2014-11-01 MED ORDER — MENTHOL 3 MG MT LOZG
1.0000 | LOZENGE | OROMUCOSAL | Status: DC | PRN
  Filled 2014-11-01: qty 9

## 2014-11-01 MED ORDER — HYDROMORPHONE HCL 1 MG/ML IJ SOLN
0.5000 mg | INTRAMUSCULAR | Status: DC | PRN
  Administered 2014-11-01 – 2014-11-02 (×4): 1 mg via INTRAVENOUS
  Filled 2014-11-01 (×4): qty 1

## 2014-11-01 MED ORDER — HYDROMORPHONE HCL 1 MG/ML IJ SOLN: 0.2500 mg | INTRAMUSCULAR | Status: DC | PRN

## 2014-11-01 MED ORDER — GLYCOPYRROLATE 0.2 MG/ML IJ SOLN
INTRAMUSCULAR | Status: DC | PRN
  Administered 2014-11-01: 0.6 mg via INTRAVENOUS

## 2014-11-01 MED ORDER — SODIUM CHLORIDE 0.9 % IR SOLN
Status: DC | PRN
Start: 2014-11-01 — End: 2014-11-01
  Administered 2014-11-01: 1000 mL

## 2014-11-01 MED ORDER — LACTATED RINGERS IV SOLN
INTRAVENOUS | Status: DC | PRN
  Administered 2014-11-01: 10:00:00 via INTRAVENOUS

## 2014-11-01 MED ORDER — AMLODIPINE BESYLATE 5 MG PO TABS
5.0000 mg | ORAL_TABLET | Freq: Every day | ORAL | Status: DC
  Administered 2014-11-01: 5 mg via ORAL
  Filled 2014-11-01 (×2): qty 1

## 2014-11-01 MED ORDER — ZOLPIDEM TARTRATE 10 MG PO TABS: 10.0000 mg | ORAL_TABLET | Freq: Every day | ORAL | Status: DC

## 2014-11-01 MED ORDER — ONDANSETRON HCL 4 MG/2ML IJ SOLN: 4.0000 mg | Freq: Four times a day (QID) | INTRAMUSCULAR | Status: DC | PRN

## 2014-11-01 MED ORDER — ONDANSETRON HCL 4 MG/2ML IJ SOLN
INTRAMUSCULAR | Status: AC
  Filled 2014-11-01: qty 2

## 2014-11-01 MED ORDER — METHOCARBAMOL 500 MG PO TABS
500.0000 mg | ORAL_TABLET | Freq: Four times a day (QID) | ORAL | Status: DC | PRN
  Administered 2014-11-01 – 2014-11-02 (×2): 500 mg via ORAL
  Filled 2014-11-01 (×3): qty 1

## 2014-11-01 MED ORDER — CEFAZOLIN SODIUM 1-5 GM-% IV SOLN
1.0000 g | Freq: Four times a day (QID) | INTRAVENOUS | Status: AC
  Administered 2014-11-01 – 2014-11-02 (×3): 1 g via INTRAVENOUS
  Filled 2014-11-01 (×4): qty 50

## 2014-11-01 MED ORDER — NEOSTIGMINE METHYLSULFATE 10 MG/10ML IV SOLN
INTRAVENOUS | Status: DC | PRN
  Administered 2014-11-01: 4 mg via INTRAVENOUS

## 2014-11-01 MED ORDER — ONDANSETRON HCL 4 MG PO TABS: 4.0000 mg | ORAL_TABLET | Freq: Three times a day (TID) | ORAL | Status: DC | PRN

## 2014-11-01 MED ORDER — DIPHENHYDRAMINE HCL 12.5 MG/5ML PO ELIX: 12.5000 mg | ORAL_SOLUTION | ORAL | Status: DC | PRN

## 2014-11-01 MED ORDER — ARTIFICIAL TEARS OP OINT
TOPICAL_OINTMENT | OPHTHALMIC | Status: DC | PRN
  Administered 2014-11-01: 1 via OPHTHALMIC

## 2014-11-01 MED ORDER — ONDANSETRON HCL 4 MG PO TABS: 4.0000 mg | ORAL_TABLET | Freq: Four times a day (QID) | ORAL | Status: DC | PRN

## 2014-11-01 MED ORDER — PROMETHAZINE HCL 25 MG/ML IJ SOLN: 6.2500 mg | INTRAMUSCULAR | Status: DC | PRN

## 2014-11-01 MED ORDER — FENTANYL CITRATE 0.05 MG/ML IJ SOLN
INTRAMUSCULAR | Status: AC
  Filled 2014-11-01: qty 5

## 2014-11-01 MED ORDER — PHENYLEPHRINE HCL 10 MG/ML IJ SOLN
INTRAMUSCULAR | Status: DC | PRN
  Administered 2014-11-01: 120 ug via INTRAVENOUS

## 2014-11-01 MED ORDER — METOCLOPRAMIDE HCL 5 MG PO TABS
5.0000 mg | ORAL_TABLET | Freq: Three times a day (TID) | ORAL | Status: DC | PRN
  Filled 2014-11-01: qty 2

## 2014-11-01 MED ORDER — DEXAMETHASONE SODIUM PHOSPHATE 10 MG/ML IJ SOLN
INTRAMUSCULAR | Status: AC
  Filled 2014-11-01: qty 1

## 2014-11-01 MED ORDER — ALBUMIN HUMAN 5 % IV SOLN
INTRAVENOUS | Status: DC | PRN
  Administered 2014-11-01 (×2): via INTRAVENOUS

## 2014-11-01 MED ORDER — OXYCODONE HCL 5 MG PO TABS
5.0000 mg | ORAL_TABLET | ORAL | Status: DC | PRN
  Administered 2014-11-02 (×3): 10 mg via ORAL
  Filled 2014-11-01 (×3): qty 2

## 2014-11-01 MED ORDER — BUPIVACAINE-EPINEPHRINE (PF) 0.5% -1:200000 IJ SOLN
INTRAMUSCULAR | Status: DC | PRN
  Administered 2014-11-01: 30 mL via PERINEURAL

## 2014-11-01 MED ORDER — TAMSULOSIN HCL 0.4 MG PO CAPS
0.4000 mg | ORAL_CAPSULE | Freq: Every day | ORAL | Status: DC
  Administered 2014-11-01: 0.4 mg via ORAL
  Filled 2014-11-01 (×2): qty 1

## 2014-11-01 MED ORDER — PHENYLEPHRINE 40 MCG/ML (10ML) SYRINGE FOR IV PUSH (FOR BLOOD PRESSURE SUPPORT)
PREFILLED_SYRINGE | INTRAVENOUS | Status: AC
  Filled 2014-11-01: qty 10

## 2014-11-01 MED ORDER — SENNA-DOCUSATE SODIUM 8.6-50 MG PO TABS: 2.0000 | ORAL_TABLET | Freq: Every day | ORAL | Status: DC

## 2014-11-01 MED ORDER — DEXAMETHASONE SODIUM PHOSPHATE 10 MG/ML IJ SOLN
INTRAMUSCULAR | Status: DC | PRN
  Administered 2014-11-01: 5 mg via INTRAVENOUS
  Administered 2014-11-01: 10 mg via INTRAVENOUS

## 2014-11-01 MED ORDER — ROCURONIUM BROMIDE 100 MG/10ML IV SOLN
INTRAVENOUS | Status: DC | PRN
  Administered 2014-11-01: 50 mg via INTRAVENOUS
  Administered 2014-11-01: 10 mg via INTRAVENOUS
  Administered 2014-11-01: 5 mg via INTRAVENOUS

## 2014-11-01 MED ORDER — OXYCODONE-ACETAMINOPHEN 10-325 MG PO TABS: 1.0000 | ORAL_TABLET | Freq: Four times a day (QID) | ORAL | Status: DC | PRN

## 2014-11-01 MED ORDER — OXYCODONE-ACETAMINOPHEN 5-325 MG PO TABS: 1.0000 | ORAL_TABLET | ORAL | Status: DC | PRN

## 2014-11-01 MED ORDER — OXYCODONE HCL 5 MG/5ML PO SOLN: 5.0000 mg | Freq: Once | ORAL | Status: DC | PRN

## 2014-11-01 MED ORDER — ALUM & MAG HYDROXIDE-SIMETH 200-200-20 MG/5ML PO SUSP: 30.0000 mL | ORAL | Status: DC | PRN

## 2014-11-01 MED ORDER — ACETAMINOPHEN 325 MG PO TABS: 650.0000 mg | ORAL_TABLET | Freq: Four times a day (QID) | ORAL | Status: DC | PRN

## 2014-11-01 MED ORDER — EPHEDRINE SULFATE 50 MG/ML IJ SOLN
INTRAMUSCULAR | Status: DC | PRN
  Administered 2014-11-01 (×2): 10 mg via INTRAVENOUS
  Administered 2014-11-01 (×2): 5 mg via INTRAVENOUS

## 2014-11-01 MED ORDER — POTASSIUM CHLORIDE IN NACL 20-0.45 MEQ/L-% IV SOLN
INTRAVENOUS | Status: DC
  Administered 2014-11-01 – 2014-11-02 (×2): via INTRAVENOUS
  Filled 2014-11-01 (×6): qty 1000

## 2014-11-01 MED ORDER — PHENYLEPHRINE HCL 10 MG/ML IJ SOLN
INTRAMUSCULAR | Status: AC
  Filled 2014-11-01: qty 1

## 2014-11-01 MED ORDER — FENTANYL CITRATE 0.05 MG/ML IJ SOLN
INTRAMUSCULAR | Status: AC
  Administered 2014-11-01: 50 ug
  Filled 2014-11-01: qty 2

## 2014-11-01 MED ORDER — SCOPOLAMINE 1 MG/3DAYS TD PT72
1.0000 | MEDICATED_PATCH | TRANSDERMAL | Status: DC
  Administered 2014-11-01: 1.5 mg via TRANSDERMAL
  Filled 2014-11-01: qty 1

## 2014-11-01 MED ORDER — PHENOL 1.4 % MT LIQD: 1.0000 | OROMUCOSAL | Status: DC | PRN

## 2014-11-01 MED ORDER — MAGNESIUM CITRATE PO SOLN: 1.0000 | Freq: Once | ORAL | Status: AC | PRN

## 2014-11-01 MED ORDER — POLYETHYLENE GLYCOL 3350 17 G PO PACK: 17.0000 g | PACK | Freq: Every day | ORAL | Status: DC | PRN

## 2014-11-01 MED ORDER — ONDANSETRON HCL 4 MG/2ML IJ SOLN
INTRAMUSCULAR | Status: DC | PRN
  Administered 2014-11-01: 4 mg via INTRAVENOUS

## 2014-11-01 MED ORDER — MIDAZOLAM HCL 2 MG/2ML IJ SOLN
INTRAMUSCULAR | Status: AC
  Administered 2014-11-01: 2 mg
  Filled 2014-11-01: qty 2

## 2014-11-01 SURGICAL SUPPLY — 65 items
APL SKNCLS STERI-STRIP NONHPOA (GAUZE/BANDAGES/DRESSINGS)
BENZOIN TINCTURE PRP APPL 2/3 (GAUZE/BANDAGES/DRESSINGS) ×1 IMPLANT
BLADE SAW SGTL MED 73X18.5 STR (BLADE) ×3 IMPLANT
BOWL SMART MIX CTS (DISPOSABLE) IMPLANT
BRUSH FEMORAL CANAL (MISCELLANEOUS) IMPLANT
CAP TOTAL SHOULDER ×2 IMPLANT
CEMENT BONE DEPUY (Cement) ×3 IMPLANT
CLOSURE STERI-STRIP 1/2X4 (GAUZE/BANDAGES/DRESSINGS) ×1
CLSR STERI-STRIP ANTIMIC 1/2X4 (GAUZE/BANDAGES/DRESSINGS) ×2 IMPLANT
COVER SURGICAL LIGHT HANDLE (MISCELLANEOUS) ×3 IMPLANT
COVER TABLE BACK 60X90 (DRAPES) IMPLANT
DRAPE C-ARM 42X72 X-RAY (DRAPES) IMPLANT
DRAPE IMP U-DRAPE 54X76 (DRAPES) ×3 IMPLANT
DRAPE INCISE IOBAN 66X45 STRL (DRAPES) ×3 IMPLANT
DRAPE U-SHAPE 47X51 STRL (DRAPES) ×3 IMPLANT
DRSG MEPILEX BORDER 4X8 (GAUZE/BANDAGES/DRESSINGS) ×3 IMPLANT
DURAPREP 26ML APPLICATOR (WOUND CARE) ×3 IMPLANT
ELECT REM PT RETURN 9FT ADLT (ELECTROSURGICAL) ×3
ELECTRODE REM PT RTRN 9FT ADLT (ELECTROSURGICAL) ×1 IMPLANT
EVACUATOR 1/8 PVC DRAIN (DRAIN) IMPLANT
FACESHIELD WRAPAROUND (MASK) IMPLANT
GAUZE SPONGE 4X4 12PLY STRL (GAUZE/BANDAGES/DRESSINGS) ×1 IMPLANT
GLOVE BIOGEL PI IND STRL 8 (GLOVE) ×1 IMPLANT
GLOVE BIOGEL PI INDICATOR 8 (GLOVE) ×2
GLOVE BIOGEL PI ORTHO PRO SZ8 (GLOVE) ×2
GLOVE ORTHO TXT STRL SZ7.5 (GLOVE) ×3 IMPLANT
GLOVE PI ORTHO PRO STRL SZ8 (GLOVE) ×1 IMPLANT
GLOVE SURG ORTHO 8.0 STRL STRW (GLOVE) ×6 IMPLANT
GOWN STRL REUS W/ TWL XL LVL3 (GOWN DISPOSABLE) IMPLANT
GOWN STRL REUS W/TWL 2XL LVL3 (GOWN DISPOSABLE) ×3 IMPLANT
GOWN STRL REUS W/TWL XL LVL3 (GOWN DISPOSABLE)
HANDPIECE INTERPULSE COAX TIP (DISPOSABLE) ×3
HOOD PEEL AWAY FACE SHEILD DIS (HOOD) ×8 IMPLANT
KIT BASIN OR (CUSTOM PROCEDURE TRAY) ×3 IMPLANT
KIT ROOM TURNOVER OR (KITS) ×3 IMPLANT
MANIFOLD NEPTUNE II (INSTRUMENTS) ×3 IMPLANT
NDL HYPO 25GX1X1/2 BEV (NEEDLE) IMPLANT
NEEDLE 1/2 CIR CATGUT .05X1.09 (NEEDLE) IMPLANT
NEEDLE HYPO 25GX1X1/2 BEV (NEEDLE) IMPLANT
NS IRRIG 1000ML POUR BTL (IV SOLUTION) ×3 IMPLANT
PACK SHOULDER (CUSTOM PROCEDURE TRAY) ×3 IMPLANT
PACK UNIVERSAL I (CUSTOM PROCEDURE TRAY) ×3 IMPLANT
PAD ARMBOARD 7.5X6 YLW CONV (MISCELLANEOUS) ×6 IMPLANT
SET HNDPC FAN SPRY TIP SCT (DISPOSABLE) IMPLANT
SLING ARM IMMOBILIZER LRG (SOFTGOODS) ×4 IMPLANT
SLING ARM IMMOBILIZER MED (SOFTGOODS) IMPLANT
SMARTMIX MINI TOWER (MISCELLANEOUS)
SPONGE LAP 18X18 X RAY DECT (DISPOSABLE) ×3 IMPLANT
SUCTION FRAZIER TIP 10 FR DISP (SUCTIONS) ×3 IMPLANT
SUPPORT WRAP ARM LG (MISCELLANEOUS) ×3 IMPLANT
SUT FIBERWIRE #2 38 REV NDL BL (SUTURE) ×18
SUT MAXBRAID (SUTURE) IMPLANT
SUT MNCRL AB 4-0 PS2 18 (SUTURE) IMPLANT
SUT VIC AB 0 CT1 27 (SUTURE) ×3
SUT VIC AB 0 CT1 27XBRD ANBCTR (SUTURE) ×1 IMPLANT
SUT VIC AB 2-0 CT1 27 (SUTURE)
SUT VIC AB 2-0 CT1 TAPERPNT 27 (SUTURE) IMPLANT
SUT VIC AB 3-0 SH 8-18 (SUTURE) ×3 IMPLANT
SUTURE FIBERWR#2 38 REV NDL BL (SUTURE) ×5 IMPLANT
SYR CONTROL 10ML LL (SYRINGE) IMPLANT
TAPE STRIPS DRAPE STRL (GAUZE/BANDAGES/DRESSINGS) ×2 IMPLANT
TOWEL OR 17X24 6PK STRL BLUE (TOWEL DISPOSABLE) ×3 IMPLANT
TOWEL OR 17X26 10 PK STRL BLUE (TOWEL DISPOSABLE) ×3 IMPLANT
TOWER SMARTMIX MINI (MISCELLANEOUS) IMPLANT
WATER STERILE IRR 1000ML POUR (IV SOLUTION) ×3 IMPLANT

## 2014-11-01 NOTE — Plan of Care (Signed)
Problem: Phase II Progression Outcomes Goal: Vital signs stable Outcome: Completed/Met Date Met:  11/01/14

## 2014-11-01 NOTE — Discharge Instructions (Signed)
Diet: As you were doing prior to hospitalization   Shower:  May shower but keep the wounds dry, use an occlusive plastic wrap, NO SOAKING IN TUB.  If the bandage gets wet, change with a clean dry gauze.  Dressing:  You may change your dressing 3-5 days after surgery.  Then change the dressing daily with sterile gauze dressing.    There are sticky tapes (steri-strips) on your wounds and all the stitches are absorbable.  Leave the steri-strips in place when changing your dressings, they will peel off with time, usually 2-3 weeks.  Activity:  Increase activity slowly as tolerated, but follow the weight bearing instructions below.  No lifting or driving for 6 weeks.  Weight Bearing:   Sling at all times except hygiene and bathing..    To prevent constipation: you may use a stool softener such as -  Colace (over the counter) 100 mg by mouth twice a day  Drink plenty of fluids (prune juice may be helpful) and high fiber foods Miralax (over the counter) for constipation as needed.    Itching:  If you experience itching with your medications, try taking only a single pain pill, or even half a pain pill at a time.  You may take up to 10 pain pills per day, and you can also use benadryl over the counter for itching or also to help with sleep.   Precautions:  If you experience chest pain or shortness of breath - call 911 immediately for transfer to the hospital emergency department!!  If you develop a fever greater that 101 F, purulent drainage from wound, increased redness or drainage from wound, or calf pain -- Call the office at [phone]                                                Follow- Up Appointment:  Please call for an appointment to be seen in 2 weeks [place] - [phone]

## 2014-11-01 NOTE — Progress Notes (Signed)
Orthopedic Tech Progress Note Patient Details:  Sheral FlowDavid A Leh Dec 26, 1940 [id number] Patient already has arm sling on. Patient ID: Sheral FlowDavid A Dossett, male   DOB: Dec 26, 1940, 73 y.o.   MRN: [id number]   [name], Lilliahna Schubring Craig 11/01/2014, 3:33 PM

## 2014-11-01 NOTE — Transfer of Care (Signed)
Immediate Anesthesia Transfer of Care Note  Patient: Mitchell Harris  Procedure(s) Performed: Procedure(s): TOTAL SHOULDER ARTHROPLASTY (Left)  Patient Location: PACU  Anesthesia Type:GA combined with regional for post-op pain  Level of Consciousness: awake, alert , oriented and patient cooperative  Airway & Oxygen Therapy: Patient Spontanous Breathing and Patient connected to nasal cannula oxygen  Post-op Assessment: Report given to PACU RN and Post -op Vital signs reviewed and stable  Post vital signs: Reviewed and stable  Complications: No apparent anesthesia complications

## 2014-11-01 NOTE — Progress Notes (Signed)
Orthopedic Tech Progress Note Patient Details:  Mitchell FlowDavid A Cumpton 09-15-1941 [id number] Patient unable to use ohf  Patient ID: Mitchell Harris, male   DOB: 09-15-1941, 73 y.o.   MRN: [id number]   [name], [name] 11/01/2014, 3:28 PM

## 2014-11-01 NOTE — Op Note (Signed)
11/01/2014  12:55 PM  PATIENT:  Mitchell Harris    PRE-OPERATIVE DIAGNOSIS:  LEFT SHOULDER PRIMARY LOCALIZED OSTEOARTHRITIS  POST-OPERATIVE DIAGNOSIS:  Same  PROCEDURE:  TOTAL SHOULDER ARTHROPLASTY  SURGEON:  Eulas PostLANDAU,Alexsander Cavins P, MD  PHYSICIAN ASSISTANT: Janace LittenBrandon Parry, OPA-C, present and scrubbed throughout the case, critical for completion in a timely fashion, and for retraction, instrumentation, and closure.  ANESTHESIA:   General  PREOPERATIVE INDICATIONS:  Mitchell Harris is a  73 y.o. male with a diagnosis of DJD LEFT SHOULDER who failed conservative measures and elected for surgical management.    The risks benefits and alternatives were discussed with the patient preoperatively including but not limited to the risks of infection, bleeding, nerve injury, cardiopulmonary complications, the need for revision surgery, dislocation, loosening, incomplete relief of pain, among others, and the patient was willing to proceed.   OPERATIVE IMPLANTS: Biomet size 12 mini press-fit humeral stem, size 46+24 Versa-dial humeral head, set in the c position with increased coverage posteriorly, with a medium cemented glenoid polyethylene 3 peg implant with a central regenerex noncemented post.   OPERATIVE FINDINGS: Advanced glenohumeral osteoarthritis involving the glenoid and the humeral head with substantial osteophyte formation inferiorly.   OPERATIVE PROCEDURE: The patient was brought to the operating room and placed in the supine position. General anesthesia was administered. IV antibiotics were given.  The upper extremity was prepped and draped in usual sterile fashion. The patient was in a beachchair position with all bony prominences padded.   Time out was performed and a deltopectoral approach was carried out. The biceps tendon was tenodesed to the pectoralis tendon. The subscapularis was released, tagging it with a #2 Fiberwire, leaving a cuff of tendon for repair.   The inferior osteophyte was  removed, and release of the capsule off of the humeral side was completed. The head was dislocated, and I reamed sequentially. I placed the humeral cutting guide at 30 of retroversion, and then pinned this into place, and made my humeral neck cut. This was at the appropriate level.   I then placed deep retractors and exposed the glenoid. I excised the labrum circumferentially, taking care to protect the axillary nerve inferiorly.   I then placed a guidewire into the center position, controlling appropriate version and inclination. I then reamed over the guidewire with the small reamer, and was satisfied with the preparation. I preserved the subchondral bone in order to maximize the strength and minimize the risk for subsequent subsidence.   I then drilled the central hole for the regenerex peg, and then placed the guide, and then drilled the 3 peripheral peg holes. I had excellent bony circumferential contact.   I then cleaned the glenoid, irrigated it copiously, and then dried it and cemented the prosthesis into place. Excellent seating was achieved. I had full exposure. The cement cured, and then I turned my attention to the humeral side.   I sequentially broached, up to the selected size, with the broach set at 30 of retroversion. I then placed the real stem. I trialed with multiple heads, and the above-named component was selected. Increased posterior coverage improved the coverage. The soft tissue tension was appropriate.   I then impacted the real humeral head into place, reduced the head, and irrigated copiously. Excellent stability and range of motion was achieved. I repaired the subscapularis with 4 #2 Fiberwire, as well as the rotator interval, and irrigated copiously once more. The subcutaneous tissue was closed with Vicryl including the deltopectoral fascia.   The  skin was closed with Steri-Strips and sterile gauze was applied. He had a preoperative nerve block. He tolerated the procedure  well and there were no complications.

## 2014-11-01 NOTE — H&P (Signed)
PREOPERATIVE H&P  Chief Complaint: left shoulder pain  HPI: Mitchell FlowDavid A Behrman is a 73 y.o. male who presents for preoperative history and physical with a diagnosis of DJD LEFT SHOULDER. Symptoms are rated as moderate to severe, and have been worsening.  This is significantly impairing activities of daily living.  He has elected for surgical management. He has failed injections, activity modification, anti-inflammatories, and assistive devices.   Past Medical History  Diagnosis Date  . Complication of anesthesia   . PONV (postoperative nausea and vomiting)   . Family history of adverse reaction to anesthesia   . Hypertension   . Depression     no medicine, but pt. reports that he suffers with this at times   . Neuromuscular disorder     cervical degeneration  . History of blood transfusion   . Arthritis     OA- hands, knees, neck  . Macular degeneration     left eye, rec'ing injecctions    Past Surgical History  Procedure Laterality Date  . Joint replacement  2000&2007    both knees   . Shoulder arthroscopy      both shoulders   . Cervical fusion  2008  . Knee surgery      arthroscopic- both knees , L knee reconstruction prior to replacement   . Tonsillectomy    . Cholecystectomy     History   Social History  . Marital Status: Married    Spouse Name: N/A    Number of Children: N/A  . Years of Education: N/A   Social History Main Topics  . Smoking status: Never Smoker   . Smokeless tobacco: Not on file  . Alcohol Use: No  . Drug Use: No  . Sexual Activity: Not on file   Other Topics Concern  . Not on file   Social History Narrative  . No narrative on file   No family history on file. No Known Allergies Prior to Admission medications   Medication Sig Start Date End Date Taking? Authorizing Provider  amLODipine (NORVASC) 5 MG tablet Take 5 mg by mouth daily after supper.    Yes Historical Provider, MD  atorvastatin (LIPITOR) 10 MG tablet Take 10 mg by mouth  daily at 6 PM.    Yes Historical Provider, MD  celecoxib (CELEBREX) 200 MG capsule Take 200 mg by mouth daily.   Yes Historical Provider, MD  ibuprofen (ADVIL,MOTRIN) 200 MG tablet Take 600 mg by mouth every 8 (eight) hours as needed for mild pain or moderate pain.   Yes Historical Provider, MD  losartan (COZAAR) 100 MG tablet Take 100 mg by mouth daily after supper.    Yes Historical Provider, MD  tamsulosin (FLOMAX) 0.4 MG CAPS capsule Take 0.4 mg by mouth daily after supper.    Yes Historical Provider, MD  zolpidem (AMBIEN) 10 MG tablet Take 10 mg by mouth at bedtime.   Yes Historical Provider, MD     Positive ROS: All other systems have been reviewed and were otherwise negative with the exception of those mentioned in the HPI and as above.  Physical Exam: General: Alert, no acute distress Cardiovascular: No pedal edema Respiratory: No cyanosis, no use of accessory musculature GI: No organomegaly, abdomen is soft and non-tender Skin: No lesions in the area of chief complaint Neurologic: Sensation intact distally Psychiatric: Patient is competent for consent with normal mood and affect Lymphatic: No axillary or cervical lymphadenopathy  MUSCULOSKELETAL: left shoulder AROM 0-120, ER to neutral, cuff strength intact.  Positive crepitance and pain with rom.  XR with end stage left shoulder OA with osteophyte formation and complete loss of joint space with periarticular sclerosis and spurring.  Assessment: DJD LEFT SHOULDER  Plan: Plan for Procedure(s): TOTAL SHOULDER ARTHROPLASTY  The risks benefits and alternatives were discussed with the patient including but not limited to the risks of nonoperative treatment, versus surgical intervention including infection, bleeding, nerve injury,  blood clots, cardiopulmonary complications, morbidity, mortality, among others, and they were willing to proceed.   Eulas PostLANDAU,Mercia Dowe P, MD Cell [phone]   11/01/2014 7:46 AM

## 2014-11-01 NOTE — Plan of Care (Signed)
Problem: Phase II Progression Outcomes Goal: Dressings dry/intact Outcome: Completed/Met Date Met:  11/01/14

## 2014-11-01 NOTE — Anesthesia Postprocedure Evaluation (Signed)
  Anesthesia Post-op Note  Patient: Mitchell Harris  Procedure(s) Performed: Procedure(s): TOTAL SHOULDER ARTHROPLASTY (Left)  Patient Location: PACU  Anesthesia Type:General and GA combined with regional for post-op pain  Level of Consciousness: awake, alert  and oriented  Airway and Oxygen Therapy: Patient Spontanous Breathing and Patient connected to nasal cannula oxygen  Post-op Pain: none  Post-op Assessment: Post-op Vital signs reviewed, Patient's Cardiovascular Status Stable, Respiratory Function Stable, Patent Airway and No signs of Nausea or vomiting  Post-op Vital Signs: stable  Last Vitals:  Filed Vitals:   11/01/14 1831  BP: 110/59  Pulse:   Temp:   Resp:     Complications: No apparent anesthesia complications

## 2014-11-01 NOTE — Anesthesia Preprocedure Evaluation (Addendum)
Anesthesia Evaluation  Patient identified by MRN, date of birth, ID band Patient awake    Reviewed: Allergy & Precautions, H&P , NPO status , Patient's Chart, lab work & pertinent test results  History of Anesthesia Complications (+) PONV and history of anesthetic complications  Airway Mallampati: II  TM Distance: >3 FB Neck ROM: Full    Dental  (+) Dental Advisory Given, Caps, Partial Upper, Poor Dentition   Pulmonary neg pulmonary ROS,    Pulmonary exam normal       Cardiovascular hypertension, Pt. on medications     Neuro/Psych PSYCHIATRIC DISORDERS Depression negative neurological ROS     GI/Hepatic negative GI ROS, Neg liver ROS,   Endo/Other    Renal/GU negative Renal ROS     Musculoskeletal   Abdominal   Peds  Hematology   Anesthesia Other Findings   Reproductive/Obstetrics                           Anesthesia Physical Anesthesia Plan  ASA: III  Anesthesia Plan: General   Post-op Pain Management:    Induction: Intravenous  Airway Management Planned: Oral ETT  Additional Equipment:   Intra-op Plan:   Post-operative Plan: Extubation in OR  Informed Consent: I have reviewed the patients History and Physical, chart, labs and discussed the procedure including the risks, benefits and alternatives for the proposed anesthesia with the patient or authorized representative who has indicated his/her understanding and acceptance.   Dental advisory given  Plan Discussed with: CRNA, Anesthesiologist and Surgeon  Anesthesia Plan Comments:        Anesthesia Quick Evaluation

## 2014-11-01 NOTE — Anesthesia Procedure Notes (Signed)
Anesthesia Regional Block:  Interscalene brachial plexus block  Pre-Anesthetic Checklist: ,, timeout performed, Correct Patient, Correct Site, Correct Laterality, Correct Procedure, Correct Position, site marked, Risks and benefits discussed,  Surgical consent,  Pre-op evaluation,  At surgeon's request and post-op pain management  Laterality: Left  Prep: chloraprep       Needles:  Injection technique: Single-shot  Needle Type: Echogenic Stimulator Needle     Needle Length: 5cm 5 cm Needle Gauge: 22 and 22 G    Additional Needles:  Procedures: ultrasound guided (picture in chart) and nerve stimulator Interscalene brachial plexus block  Nerve Stimulator or Paresthesia:  Response: bicep contraction, 0.45 mA,   Additional Responses:   Narrative:  Start time: 11/01/2014 9:31 AM End time: 11/01/2014 9:41 AM Injection made incrementally with aspirations every 5 mL.  Performed by: Personally  Anesthesiologist: Heather RobertsSINGER, Allicia Culley  Additional Notes: Functioning IV was confirmed and monitors applied.  A 50mm 22ga echogenic arrow stimulator was used. Sterile prep and drape,hand hygiene and sterile gloves were used.Ultrasound guidance: relevant anatomy identified, needle position confirmed, local anesthetic spread visualized around nerve(s)., vascular puncture avoided.  Image printed for medical record.  Negative aspiration and negative test dose prior to incremental administration of local anesthetic. The patient tolerated the procedure well.

## 2014-11-02 LAB — BASIC METABOLIC PANEL
ANION GAP: 18 — AB (ref 5–15)
BUN: 21 mg/dL (ref 6–23)
CHLORIDE: 105 meq/L (ref 96–112)
CO2: 17 mEq/L — ABNORMAL LOW (ref 19–32)
CREATININE: 0.74 mg/dL (ref 0.50–1.35)
Calcium: 9.4 mg/dL (ref 8.4–10.5)
GFR calc Af Amer: >90 mL/min (ref >90)
Glucose, Bld: 121 mg/dL — ABNORMAL HIGH (ref 70–99)
Potassium: 4.3 mEq/L (ref 3.7–5.3)
Sodium: 140 mEq/L (ref 137–147)

## 2014-11-02 LAB — CBC
HCT: 34.4 % — ABNORMAL LOW (ref 39.0–52.0)
Hemoglobin: 11.2 g/dL — ABNORMAL LOW (ref 13.0–17.0)
MCH: 30.3 pg (ref 26.0–34.0)
MCHC: 32.6 g/dL (ref 30.0–36.0)
MCV: 93 fL (ref 78.0–100.0)
Platelets: 242 10*3/uL (ref 150–400)
RBC: 3.7 MIL/uL — ABNORMAL LOW (ref 4.22–5.81)
RDW: 13 % (ref 11.5–15.5)
WBC: 16 10*3/uL — AB (ref 4.0–10.5)

## 2014-11-02 MED ORDER — DIAZEPAM 5 MG PO TABS: 5.0000 mg | ORAL_TABLET | Freq: Four times a day (QID) | ORAL | Status: DC | PRN

## 2014-11-02 MED ORDER — DIAZEPAM 5 MG PO TABS
5.0000 mg | ORAL_TABLET | Freq: Four times a day (QID) | ORAL | Status: DC | PRN
  Administered 2014-11-02: 5 mg via ORAL
  Filled 2014-11-02: qty 1

## 2014-11-02 MED ORDER — HYDROMORPHONE HCL 2 MG PO TABS
2.0000 mg | ORAL_TABLET | ORAL | Status: DC | PRN
Start: 2014-11-02 — End: 2014-11-02
  Administered 2014-11-02 (×2): 2 mg via ORAL
  Filled 2014-11-02 (×2): qty 1

## 2014-11-02 MED ORDER — HYDROMORPHONE HCL 2 MG PO TABS: 2.0000 mg | ORAL_TABLET | ORAL | Status: DC | PRN

## 2014-11-02 NOTE — Progress Notes (Signed)
Patient ID: Mitchell Harris, male   DOB: 11/09/1941, 73 y.o.   MRN: [id number]     Subjective:  Patient reports pain as mild to moderate.  Patient states that he does not get much help with normal pain meds.  Objective:   VITALS:   Filed Vitals:   11/01/14 1831 11/01/14 2110 11/02/14 0220 11/02/14 0548  BP: 110/59 126/68 126/67 134/68  Pulse:  98 88 91  Temp:  98.7 F (37.1 C) 98.8 F (37.1 C) 97.5 F (36.4 C)  TempSrc:      Resp:  18 18 16   Height:      Weight:      SpO2:  93% 95% 98%    ABD soft Sensation intact distally Dorsiflexion/Plantar flexion intact Incision: dressing C/D/I and no drainage Good wrist and hand function   Lab Results  Component Value Date   WBC 16.0* 11/02/2014   HGB 11.2* 11/02/2014   HCT 34.4* 11/02/2014   MCV 93.0 11/02/2014   PLT 242 11/02/2014   BMET    Component Value Date/Time   NA 140 11/02/2014 0622   K 4.3 11/02/2014 0622   CL 105 11/02/2014 0622   CO2 17* 11/02/2014 0622   GLUCOSE 121* 11/02/2014 0622   BUN 21 11/02/2014 0622   CREATININE 0.74 11/02/2014 0622   CALCIUM 9.4 11/02/2014 0622   GFRNONAA >90 11/02/2014 0622   GFRAA >90 11/02/2014 0622     Assessment/Plan: 1 Day Post-Op   Principal Problem:   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 No active shoulder motion May add dilaudid and valium to the DC meds    Haskel KhanDOUGLAS Jasper Ruminski 11/02/2014, 8:34 AM   Teryl LucyJoshua Landau, MD Cell [phone]

## 2014-11-02 NOTE — Progress Notes (Signed)
PT Cancellation Note  Patient Details Name: Mitchell Harris MRN: [id number] DOB: Aug 23, 1941   Cancelled Treatment:    Reason Eval/Treat Not Completed: PT screened, no needs identified, will sign off. Spoke with OT. Pt independent from mobility standpoint. No acute PT needs warranted.    Mitchell Harris, Mitchell Szeliga [place], [place]PT  [phone] 11/02/2014, 9:16 AM

## 2014-11-02 NOTE — Evaluation (Signed)
Occupational Therapy Evaluation Patient Details Name: Mitchell FlowDavid A Fraser MRN: [id number] DOB: [birth year] Today's Date: 11/02/2014    History of Present Illness s/p L TSA   Clinical Impression   All education completed.  Pt verbalized understanding of hemi techniques for bathing and dressing, sling use, precautions, and L elbow to hand AROM.  Will follow up with MD next week.    Follow Up Recommendations  No OT follow up;Supervision - Intermittent (progress per MD)    Equipment Recommendations  None recommended by OT    Recommendations for Other Services       Precautions / Restrictions Precautions Precautions: Shoulder Shoulder Interventions: Shoulder sling/immobilizer;Off for dressing/bathing/exercises (off for exercise) Precaution Booklet Issued: Yes (comment) Precaution Comments: reviewed shoulder protocol handout Required Braces or Orthoses: Sling Restrictions Weight Bearing Restrictions:  (instructed to avoid WB)      Mobility Bed Mobility Overal bed mobility: Modified Independent             General bed mobility comments: avoided WB on L UE  Transfers Overall transfer level: Independent                    Balance                                            ADL Overall ADL's : Needs assistance/impaired                         Toilet Transfer: Independent;Ambulation   Toileting- Clothing Manipulation and Hygiene: Modified independent         General ADL Comments: Instructed pt in hemitechniques for bathing and dressing.  Educated pt in donning and doffing sling and proper positioning.  Instructed in L UE positioning in bed and chair for comfort.       Vision                     Perception     Praxis      Pertinent Vitals/Pain Pain Assessment: Faces Faces Pain Scale: Hurts a little bit Pain Location: L shoulder Pain Intervention(s): Monitored during session;Premedicated before session;Repositioned      Hand Dominance Right   Extremity/Trunk Assessment Upper Extremity Assessment Upper Extremity Assessment: RUE deficits/detail;LUE deficits/detail RUE Deficits / Details: arthritic changes in hand, longstanding shoulder limitations LUE Deficits / Details: arthritic changes in hand limiting gross grasp, full AROM elbow, forearm, wrist.   Lower Extremity Assessment Lower Extremity Assessment: Overall WFL for tasks assessed   Cervical / Trunk Assessment Cervical / Trunk Assessment:  (hx of cervical surgery)   Communication Communication Communication: No difficulties   Cognition Arousal/Alertness: Awake/alert Behavior During Therapy: WFL for tasks assessed/performed Overall Cognitive Status: Within Functional Limits for tasks assessed                     General Comments       Exercises Exercises:  (AROM L elbow, forearm, wrist, hand)     Shoulder Instructions      Home Living Family/patient expects to be discharged to:: Private residence Living Arrangements: Alone Available Help at Discharge: Family;Available 24 hours/day (going to daughter's home in [place])                             Additional Comments: Pt has  a comfort height toilet and grab bar with walk in shower at his home.      Prior Functioning/Environment Level of Independence: Independent        Comments: pt very active, own lawn care business    OT Diagnosis:     OT Problem List:     OT Treatment/Interventions:      OT Goals(Current goals can be found in the care plan section) Acute Rehab OT Goals Patient Stated Goal: get back to lawn business by March  OT Frequency:     Barriers to D/C:            Co-evaluation              End of Session    Activity Tolerance: Patient tolerated treatment well Patient left: in bed;with call bell/phone within reach;with family/visitor present   Time: [id number] OT Time Calculation (min): 36 min Charges:  OT General  Charges $OT Visit: 1 Procedure OT Evaluation $Initial OT Evaluation Tier I: 1 Procedure OT Treatments $Self Care/Home Management : 8-22 mins $Therapeutic Exercise: 8-22 mins G-Codes:    Evern BioMayberry, Jarold Macomber Lynn 11/02/2014, 9:30 AM

## 2014-11-02 NOTE — Discharge Summary (Signed)
Physician Discharge Summary  Patient ID: Mitchell FlowDavid A Canavan MRN: [id number] DOB/AGE: 1941-09-02 73 y.o.  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 Complication of anesthesia   . PONV (postoperative nausea and vomiting)   . Family history of adverse reaction to anesthesia   . Hypertension   . Depression     no medicine, but pt. reports that he suffers with this at times   . Neuromuscular disorder     cervical degeneration  . History of blood transfusion   . Arthritis     OA- hands, knees, neck  . Macular degeneration     left eye, rec'ing injecctions   . 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    He was given perioperative antibiotics:  Anti-infectives    Start     Dose/Rate Route Frequency Ordered Stop   11/01/14 1630  ceFAZolin (ANCEF) IVPB 1 g/50 mL premix     1 g100 mL/hr over 30 Minutes Intravenous Every 6 hours 11/01/14 1450 11/02/14 0435   11/01/14 0600  ceFAZolin (ANCEF) IVPB 2 g/50 mL premix     2 g100 mL/hr over 30 Minutes Intravenous On call to O.R. 10/31/14 1416 11/01/14 1030    .  He was given sequential compression devices, early ambulation for DVT prophylaxis.  He benefited maximally from the hospital stay and there were no complications.    Recent vital signs:  Filed Vitals:   11/02/14 0548  BP: 134/68  Pulse: 91  Temp: 97.5 F  (36.4 C)  Resp: 16    Recent laboratory studies:  Lab Results  Component Value Date   HGB 11.2* 11/02/2014   HGB 12.8* 10/20/2014   HGB 8.8 DELTA CHECK NOTED* 01/08/2008   Lab Results  Component Value Date   WBC 16.0* 11/02/2014   PLT 242 11/02/2014   Lab Results  Component Value Date   INR 0.9 12/30/2007   Lab Results  Component Value Date   NA 140 11/02/2014   K 4.3 11/02/2014   CL 105 11/02/2014   CO2 17* 11/02/2014   BUN 21 11/02/2014   CREATININE 0.74 11/02/2014   GLUCOSE 121* 11/02/2014    Discharge Medications:     Medication List    STOP taking these medications        celecoxib 200 MG capsule  Commonly known as:  CELEBREX     ibuprofen 200 MG tablet  Commonly known as:  ADVIL,MOTRIN      TAKE these medications        amLODipine 5 MG tablet  Commonly known as:  NORVASC  Take 5 mg by mouth daily after supper.     atorvastatin 10 MG tablet  Commonly known as:  LIPITOR  Take 10 mg by mouth daily at 6 PM.     baclofen 10 MG tablet  Commonly known as:  LIORESAL  Take 1 tablet (10 mg total) by mouth 3 (three) times daily. As needed for  muscle spasm     diazepam 5 MG tablet  Commonly known as:  VALIUM  Take 1 tablet (5 mg total) by mouth every 6 (six) hours as needed for muscle spasms.     HYDROmorphone 2 MG tablet  Commonly known as:  DILAUDID  Take 1 tablet (2 mg total) by mouth every 2 (two) hours as needed for severe pain.     losartan 100 MG tablet  Commonly known as:  COZAAR  Take 100 mg by mouth daily after supper.     ondansetron 4 MG tablet  Commonly known as:  ZOFRAN  Take 1 tablet (4 mg total) by mouth every 8 (eight) hours as needed for nausea or vomiting.     oxyCODONE-acetaminophen 10-325 MG per tablet  Commonly known as:  PERCOCET  Take 1-2 tablets by mouth every 6 (six) hours as needed for pain. MAXIMUM TOTAL ACETAMINOPHEN DOSE IS 4000 MG PER DAY     sennosides-docusate sodium 8.6-50 MG tablet  Commonly known as:   SENOKOT-S  Take 2 tablets by mouth daily.     tamsulosin 0.4 MG Caps capsule  Commonly known as:  FLOMAX  Take 0.4 mg by mouth daily after supper.     zolpidem 10 MG tablet  Commonly known as:  AMBIEN  Take 10 mg by mouth at bedtime.        Diagnostic Studies: Ct Shoulder Left Wo Contrast  10/11/2014   CLINICAL DATA:  Bilateral shoulder pain with decreased range of motion, worse on the left. History of bilateral shoulder arthroscopic surgery. Preoperative evaluation for shoulder surgery. Initial encounter.  EXAM: CT OF THE LEFT SHOULDER WITHOUT CONTRAST  TECHNIQUE: Multidetector CT imaging was performed according to the standard protocol. Multiplanar CT image reconstructions were also generated.  COMPARISON:  Chest CT 01/08/2008.  FINDINGS: There are moderately advanced glenohumeral degenerative changes with joint space loss, osteophytes and subchondral sclerosis. Several small loose bodies are present within the joint. Amorphous calcification anterior to the glenoid may represent chondrocalcinosis within displaced labral tissue.  The acromion is type 1 with mild lateral downsloping. There is mild widening of the acromioclavicular joint which may be secondary to previous distal clavicle resection. The subacromial space appears only minimally narrowed. There is no significant focal atrophy of the rotator cuff musculature.  The visualize left chest wall and left lung appear unremarkable there is atherosclerosis of the aorta and coronary arteries. Mitral annular calcifications and previous cervical fusion are noted.  IMPRESSION: Moderately advanced glenohumeral degenerative changes on the left with prominent osteophytes and small loose bodies.   Electronically Signed   By: Roxy Horseman M.D.   On: 10/11/2014 08:50   Ct Shoulder Right Wo Contrast  10/11/2014   CLINICAL DATA:  Bilateral shoulder pain with decreased range of motion, worse on the left. History of bilateral shoulder arthroscopic surgery.  Preoperative evaluation for shoulder surgery. Initial encounter.  EXAM: CT OF THE RIGHT SHOULDER WITHOUT CONTRAST  TECHNIQUE: Multidetector CT imaging was performed according to the standard protocol. Multiplanar CT image reconstructions were also generated.  COMPARISON:  Chest CT 01/08/2008.  FINDINGS: There are moderate glenohumeral degenerative changes on the right with joint space loss and osteophytes. There are probable small loose bodies within the superior subscapularis recess and superior aspect of the bicipital groove. There is a moderate-sized joint effusion.  The acromion is type 1. There are mild acromioclavicular degenerative changes. There is some fatty atrophy of the teres minor muscle. The additional components of the rotator cuff demonstrate  no significant atrophy. There appears to be mild deltoid atrophy laterally.  The visualized right chest wall and right lung appear unremarkable. Postsurgical changes status post lower cervical fusion are noted.  IMPRESSION: Moderate glenohumeral degenerative changes on the right with small loose bodies. Atrophy of the teres minor and deltoid musculature.   Electronically Signed   By: Roxy HorsemanBill  Veazey M.D.   On: 10/11/2014 08:56   Dg Shoulder Left  11/01/2014   CLINICAL DATA:  Status post left reverse total shoulder arthroplasty.  EXAM: LEFT SHOULDER - 2+ VIEW  COMPARISON:  CT left shoulder 10/10/2014  FINDINGS: Sequelae of interval left reverse total shoulder arthroplasty are identified. The prosthetic components appear located on this single projection. Postoperative gas is noted in the surrounding soft tissues. No acute fracture is identified.  IMPRESSION: Interval left total shoulder arthroplasty.   Electronically Signed   By: Sebastian AcheAllen  Grady   On: 11/01/2014 14:12    Disposition:         Follow-up Information    Follow up with Eulas PostLANDAU,Junia Nygren P, MD. Schedule an appointment as soon as possible for a visit in 2 weeks.   Specialty:  Orthopedic Surgery    Contact information:   [address] [place] [address] [phone]        Signed: Eulas PostLANDAU,Kendell Sagraves P 11/02/2014, 8:57 AM

## 2014-11-02 NOTE — Plan of Care (Signed)
Problem: Phase I Progression Outcomes Goal: OOB as tolerated unless otherwise ordered Outcome: Completed/Met Date Met:  11/02/14     

## 2014-11-02 NOTE — Progress Notes (Signed)
IV removed with no complications. Discharge instructions reviewed with patient, patient denies questions or concerns at this time. Per MD discharge note, patient discharged via wheelchair with personal belongings, prescriptions, and discharge packet.

## 2014-11-02 NOTE — Care Management Note (Signed)
CARE MANAGEMENT NOTE 11/02/2014  Patient:  Mitchell Harris,Mitchell Harris   Account Number:  [id number]  Date Initiated:  11/02/2014  Documentation initiated by:  Vance PeperBRADY,Caliya Narine  Subjective/Objective Assessment:   73 yr old male admitted with left shoulder DJD. patient had Harris Left total Shoulder.     Action/Plan:   No home health needs identified by therapy or case manager. Patient discharging home with family.   Anticipated DC Date:  11/02/2014   Anticipated DC Plan:  HOME/SELF CARE      DC Planning Services  CM consult      PAC Choice  NA   Choice offered to / List presented to:     DME arranged  NA        HH arranged  NA      Status of service:  Completed, signed off Medicare Important Message given?  NA - LOS <3 / Initial given by admissions (If response is "NO", the following Medicare IM given date fields will be blank) Date Medicare IM given:   Medicare IM given by:   Date Additional Medicare IM given:   Additional Medicare IM given by:    Discharge Disposition:  HOME/SELF CARE  Per UR Regulation:  Reviewed for med. necessity/level of care/duration of stay

## 2014-11-04 ENCOUNTER — Encounter (HOSPITAL_COMMUNITY): Payer: Self-pay | Admitting: Orthopedic Surgery

## 2014-11-15 ENCOUNTER — Encounter (INDEPENDENT_AMBULATORY_CARE_PROVIDER_SITE_OTHER): Payer: Medicare Other | Admitting: Ophthalmology

## 2014-11-15 DIAGNOSIS — H3532 Exudative age-related macular degeneration: Secondary | ICD-10-CM | POA: Diagnosis not present

## 2014-11-15 DIAGNOSIS — I10 Essential (primary) hypertension: Secondary | ICD-10-CM | POA: Diagnosis not present

## 2014-11-15 DIAGNOSIS — H3531 Nonexudative age-related macular degeneration: Secondary | ICD-10-CM | POA: Diagnosis not present

## 2014-11-15 DIAGNOSIS — H43813 Vitreous degeneration, bilateral: Secondary | ICD-10-CM

## 2014-11-15 DIAGNOSIS — H35033 Hypertensive retinopathy, bilateral: Secondary | ICD-10-CM | POA: Diagnosis not present

## 2014-12-20 ENCOUNTER — Encounter (INDEPENDENT_AMBULATORY_CARE_PROVIDER_SITE_OTHER): Payer: Medicare HMO | Admitting: Ophthalmology

## 2014-12-20 DIAGNOSIS — H3532 Exudative age-related macular degeneration: Secondary | ICD-10-CM

## 2014-12-20 DIAGNOSIS — H3531 Nonexudative age-related macular degeneration: Secondary | ICD-10-CM

## 2014-12-20 DIAGNOSIS — H43813 Vitreous degeneration, bilateral: Secondary | ICD-10-CM

## 2014-12-20 DIAGNOSIS — H35033 Hypertensive retinopathy, bilateral: Secondary | ICD-10-CM

## 2014-12-20 DIAGNOSIS — I10 Essential (primary) hypertension: Secondary | ICD-10-CM

## 2015-01-23 ENCOUNTER — Other Ambulatory Visit: Payer: Self-pay | Admitting: Neurosurgery

## 2015-01-23 DIAGNOSIS — S129XXD Fracture of neck, unspecified, subsequent encounter: Secondary | ICD-10-CM

## 2015-01-24 ENCOUNTER — Encounter (INDEPENDENT_AMBULATORY_CARE_PROVIDER_SITE_OTHER): Payer: Medicare HMO | Admitting: Ophthalmology

## 2015-01-24 DIAGNOSIS — H3531 Nonexudative age-related macular degeneration: Secondary | ICD-10-CM

## 2015-01-24 DIAGNOSIS — H35033 Hypertensive retinopathy, bilateral: Secondary | ICD-10-CM

## 2015-01-24 DIAGNOSIS — H43813 Vitreous degeneration, bilateral: Secondary | ICD-10-CM

## 2015-01-24 DIAGNOSIS — I10 Essential (primary) hypertension: Secondary | ICD-10-CM

## 2015-01-24 DIAGNOSIS — H3532 Exudative age-related macular degeneration: Secondary | ICD-10-CM

## 2015-02-07 ENCOUNTER — Other Ambulatory Visit: Payer: Self-pay | Admitting: Neurosurgery

## 2015-02-07 DIAGNOSIS — M4722 Other spondylosis with radiculopathy, cervical region: Secondary | ICD-10-CM

## 2015-02-13 NOTE — Discharge Instructions (Signed)
Myelogram Discharge Instructions  1. Go home and rest quietly for the next 24 hours.  It is important to lie flat for the next 24 hours.  Get up only to go to the restroom.  You may lie in the bed or on a couch on your back, your stomach, your left side or your right side.  You may have one pillow under your head.  You may have pillows between your knees while you are on your side or under your knees while you are on your back.  2. DO NOT drive today.  Recline the seat as far back as it will go, while still wearing your seat belt, on the way home.  3. You may get up to go to the bathroom as needed.  You may sit up for 10 minutes to eat.  You may resume your normal diet and medications unless otherwise indicated.  Drink plenty of extra fluids today and tomorrow.  4. The incidence of a spinal headache with nausea and/or vomiting is about 5% (one in 20 patients).  If you develop a headache, lie flat and drink plenty of fluids until the headache goes away.  Caffeinated beverages may be helpful.  If you develop severe nausea and vomiting or a headache that does not go away with flat bed rest, call [phone].  5. You may resume normal activities after your 24 hours of bed rest is over; however, do not exert yourself strongly or do any heavy lifting tomorrow.  6. Call your physician for a follow-up appointment.   You may resume Citalopram on Wednesday, February 15, 2015 after 7:30a.m.

## 2015-02-14 ENCOUNTER — Ambulatory Visit
Admission: RE | Admit: 2015-02-14 | Discharge: 2015-02-14 | Disposition: A | Discharge status: Home / Self Care | Payer: Self-pay | Source: Ambulatory Visit | Attending: Neurosurgery | Admitting: Neurosurgery

## 2015-02-14 ENCOUNTER — Ambulatory Visit
Admission: RE | Admit: 2015-02-14 | Discharge: 2015-02-14 | Disposition: A | Discharge status: Home / Self Care | Payer: Medicare PPO | Source: Ambulatory Visit | Attending: Neurosurgery | Admitting: Neurosurgery

## 2015-02-14 ENCOUNTER — Encounter (INDEPENDENT_AMBULATORY_CARE_PROVIDER_SITE_OTHER): Payer: Self-pay

## 2015-02-14 DIAGNOSIS — M4722 Other spondylosis with radiculopathy, cervical region: Secondary | ICD-10-CM

## 2015-02-14 DIAGNOSIS — S129XXD Fracture of neck, unspecified, subsequent encounter: Secondary | ICD-10-CM

## 2015-02-14 IMAGING — RF DG MYELOGRAPHY LUMBAR INJ CERVICAL
12 of 24 series · 12 of 24 positions shown · non-contrast
Comparison: MRI cervical spine [DATE] performed at [REDACTED].

CLINICAL DATA: Severe neck pain. Previous cervical fusion.
Increasing weakness of the RIGHT arm particularly the RIGHT.
TECHNIQUE: Contiguous axial images were obtained through the Cervical spine
after the intrathecal infusion of infusion. Coronal and sagittal
reconstructions were obtained of the axial image sets.

[Series 2: run · 1 of 1 slices shown (1 of 12)]
[im 1/1]
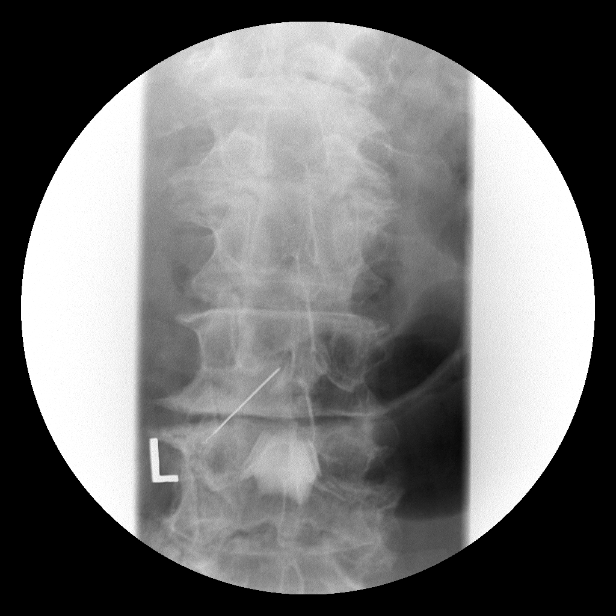

[Series 4: run · 1 of 1 slices shown (2 of 12)]
[im 1/1]
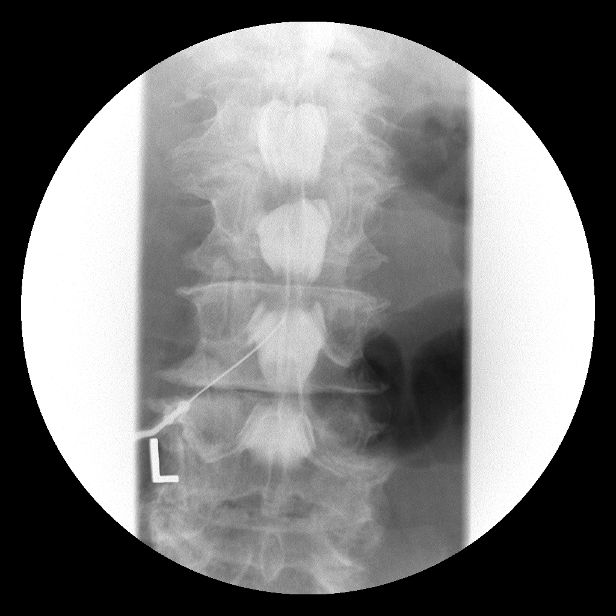

[Series 6: run · 1 of 1 slices shown (3 of 12)]
[im 1/1]
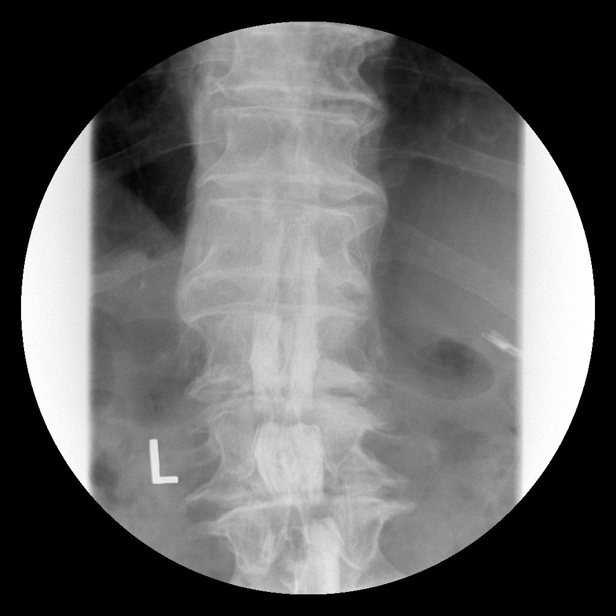

[Series 8: run · 1 of 1 slices shown (4 of 12)]
[im 1/1]
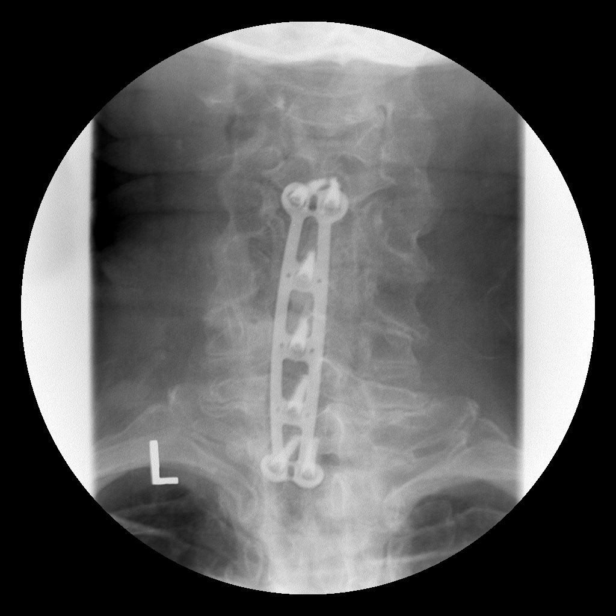

[Series 10: run · 1 of 1 slices shown (5 of 12)]
[im 1/1]
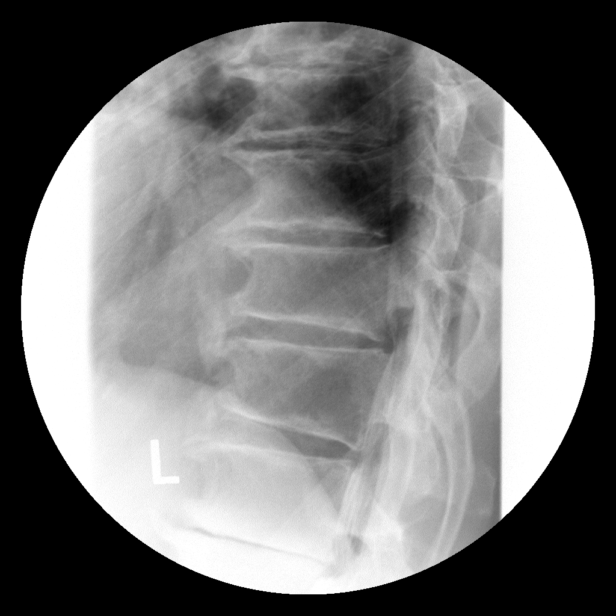

[Series 12: run · 1 of 1 slices shown (6 of 12)]
[im 1/1]
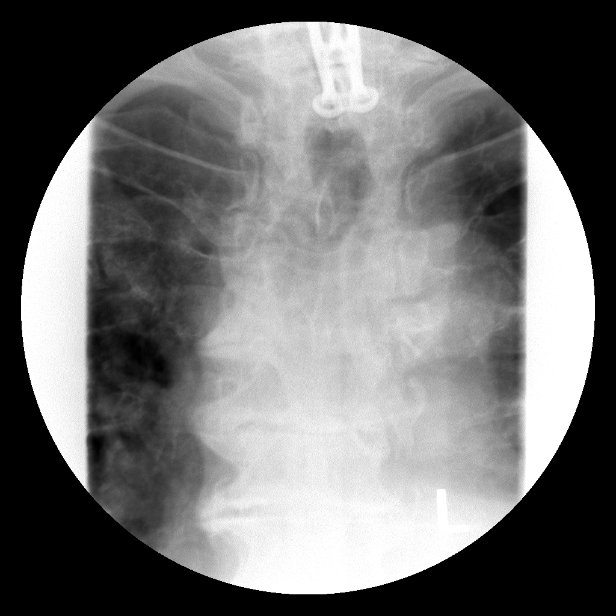

[Series 14: run · 1 of 1 slices shown (7 of 12)]
[im 1/1]
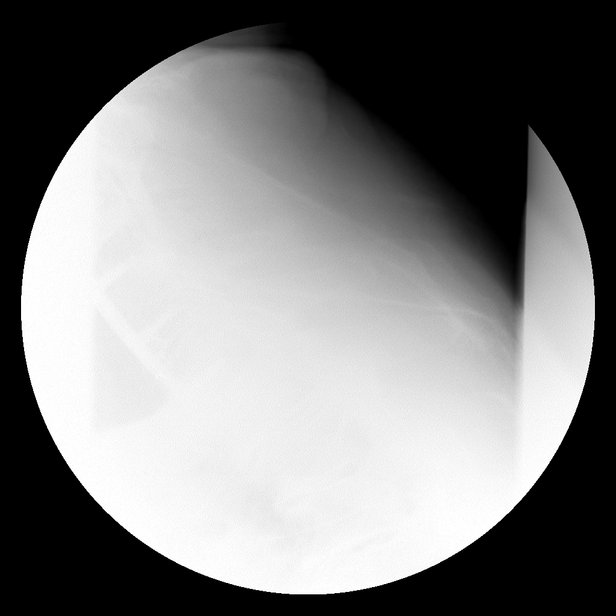

[Series 16: run · 1 of 1 slices shown (8 of 12)]
[im 1/1]
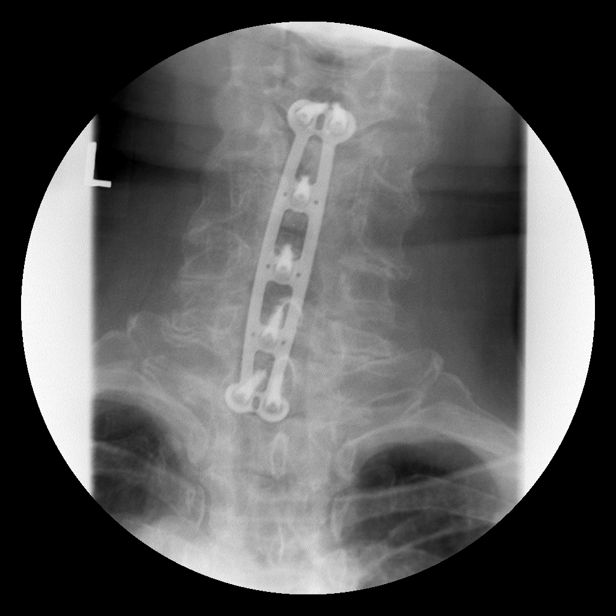

[Series 18: run · 1 of 1 slices shown (9 of 12)]
[im 1/1]
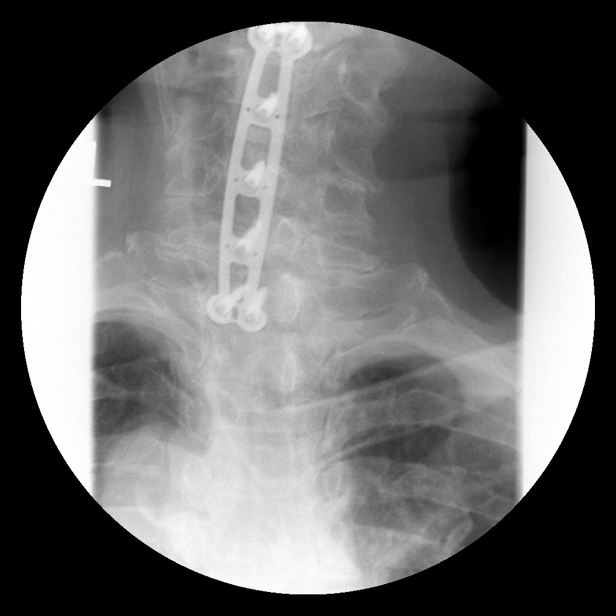

[Series 20: run · 1 of 1 slices shown (10 of 12)]
[im 1/1]
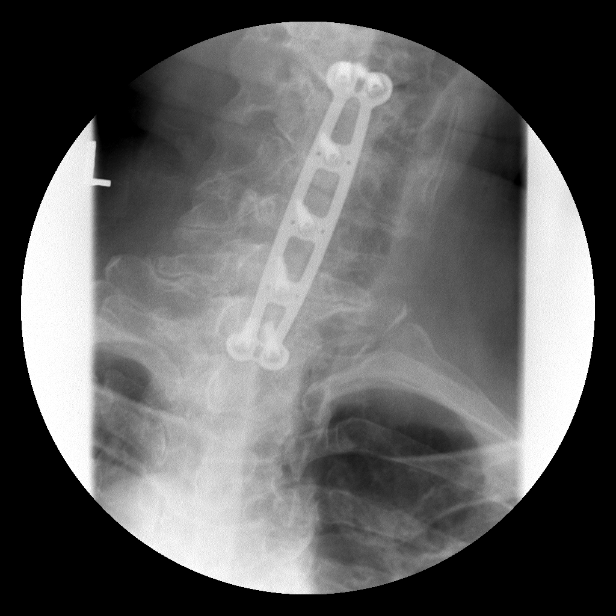

[Series 22: run · 1 of 1 slices shown (11 of 12)]
[im 1/1]
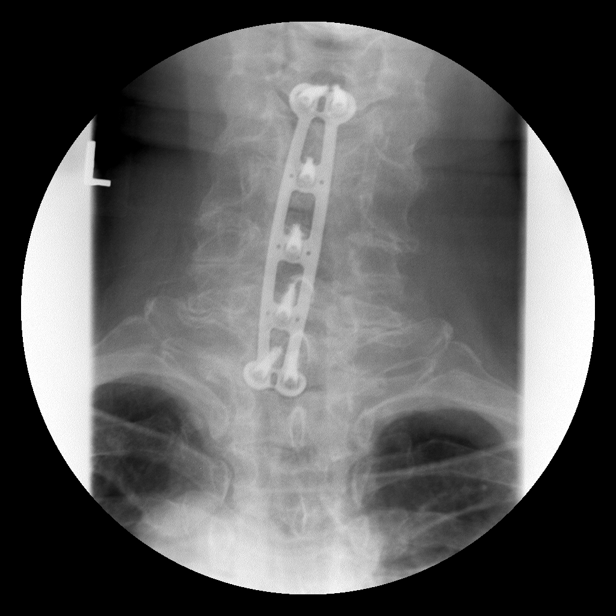

[Series 24: run · 1 of 1 slices shown (12 of 12)]
[im 1/1]
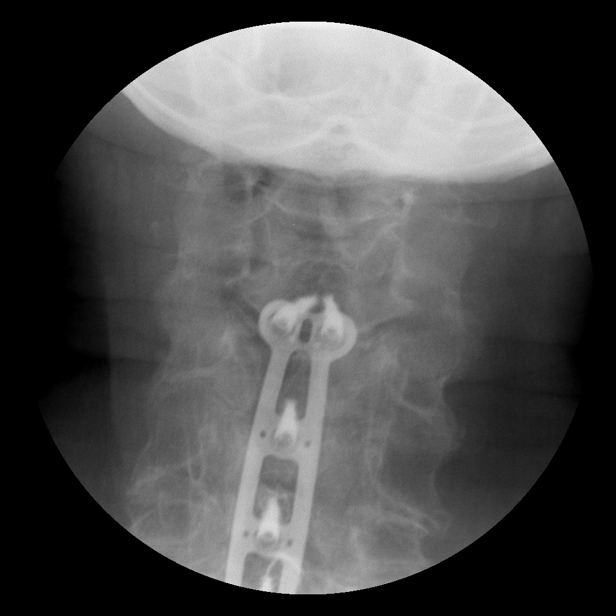

[12 of 24 positions shown; findings below may reference images not displayed]

FLUOROSCOPY TIME:  5 minutes corresponding to a Dose Area Product of
62 dGy*cm2

PROCEDURE:
LUMBAR PUNCTURE FOR CERVICAL MYELOGRAM

After thorough discussion of risks and benefits of the procedure
including bleeding, infection, injury to nerves, blood vessels,
adjacent structures as well as headache and CSF leak, written and
oral informed consent was obtained. Consent was obtained by Dr. FREDDY OSCAR
FREDDY OSCAR. We discussed the high likelihood of obtaining a diagnostic
study.

Patient was positioned prone on the fluoroscopy table. Local
anesthesia was provided with 1% lidocaine without epinephrine after
prepped and draped in the usual sterile fashion. Puncture was
performed at L4-5 initially using a 3 1/2 inch 22-gauge spinal
needle via LEFT paramedian approach. Due to severe spondylosis, the
contrast did not pass cephalad beyond the L3-4 disc space. This
approach was therefore band. Additional puncture was performed on
the LEFT at L3. Significant lumbar spondylosis was present with
stenosis at L1-2, L2-3, and L3-4,, but there was adequate contrast
which passed cephalad. Using a single pass through the dura, the
needle was placed within the thecal sac, with return of clear CSF.
10 mL of [J4] was injected into the thecal sac, with normal
opacification of the nerve roots and cauda equina consistent with
free flow within the subarachnoid space. A small amount of air was
added to promote cephalad migration. The patient was then moved to
the trendelenburg position and contrast flowed into the Cervical
spine region.

I personally performed the lumbar puncture and administered the
intrathecal contrast. I also personally supervised acquisition of
the myelogram images.
FINDINGS: CERVICAL MYELOGRAM FINDINGS:

The was fair opacification of the cervical subarachnoid space for
myelography, much better on CT. There has been previous ACDF C3-C7
with anterior plating. Incomplete fusion across the C3-4, C4-5, and
C6-7 interspaces is noted. The C5-6 fusion is solid. Both C3 screws
are fractured. The midline C4 screw is fractured.

CT CERVICAL MYELOGRAM FINDINGS:

Alignment: Trace anterolisthesis C2-3 representing adjacent segment
disease. Trace anterolisthesis C7-T1 and T1-2 also compensatory.

Vertebrae: No worrisome osseous lesion.

Cord: Moderate stenosis at T1 tube with slight effacement anterior
subarachnoid space and posterior cord displacement but no frank cord
compression.

Posterior Fossa: No tonsillar herniation

Paraspinal tissues: No masses. BILATERAL carotid calcification. Lung
apices clear.

Disc levels:

The individual disc spaces were examined as follows:

C2-3:  Mild bulge.  BILATERAL facet arthropathy.  No impingement.

C3-4: Pseudarthrosis. Adequate foraminal and central canal
decompression. No impingement.

C4-5: Pseudarthrosis. Adequate foraminal and central canal
decompression. No impingement

C5-6:  Solid fusion.  No impingement.

C6-7: Pseudarthrosis. Residual uncinate spurring on the LEFT narrows
the foramen compressing the LEFT C7 nerve root. Moderate neural
foraminal narrowing on the RIGHT potentially affects the RIGHT C7
nerve root as well.

C7-T1: Severe facet arthropathy. Anterior spurring. Shallow
subligamentous protrusion is partially calcified. This is minimally
eccentric to the LEFT. 2 mm facet mediated slip. Mild bulge. No
RIGHT or LEFT-sided neural impingement is established new new

T1-T2: Central disc osteophyte complex with superimposed central
extrusion. Slight cephalad migration. Slight effacement anterior
subarachnoid space without frank cord compression. Neural foramina
appear patent.

T2-T3: Moderate posterior element hypertrophy. No significant canal
stenosis. No disc protrusion. No definite impingement.

Compared with prior MR, good general agreement.
IMPRESSION: Status post C3-C7 ACDF. Pseudarthrosis at C3-4, C4-5, and C6-7.
Hardware failure with screw fracture BILATERAL at C3 and midline C4.

Residual uncinate spurring at C6-7, LEFT much greater than RIGHT.

Severe facet arthropathy at C7-T1 with 2 mm of slip. No impingement
at this level.

Moderately prominent central extrusion at T1-2 with effacement
anterior subarachnoid space but no significant cord flattening or
neural impingement.

## 2015-02-14 IMAGING — CT CT CERVICAL SPINE W/ CM
4 of 5 series · 14 of 33 positions shown, 16 images · non-contrast
Comparison: MRI cervical spine [DATE] performed at [REDACTED].

CLINICAL DATA: Severe neck pain. Previous cervical fusion.
Increasing weakness of the RIGHT arm particularly the RIGHT.
TECHNIQUE: Contiguous axial images were obtained through the Cervical spine
after the intrathecal infusion of infusion. Coronal and sagittal
reconstructions were obtained of the axial image sets.

[Series 3: c spine bone · axial · 0.31mm/px · z∈[+67,+147]mm · 2 of 98 slices shown]
[im 33/98  bone]
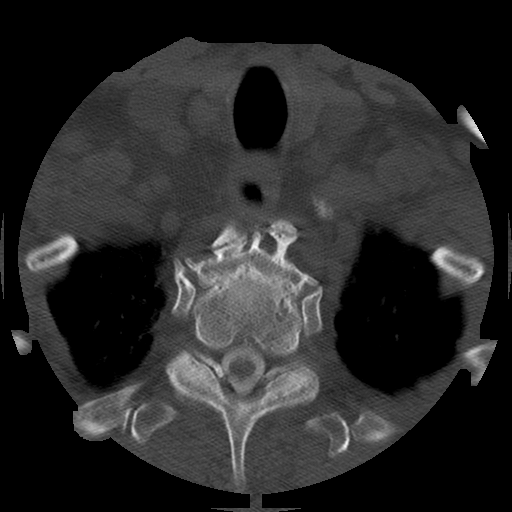
[im 65/98  bone]
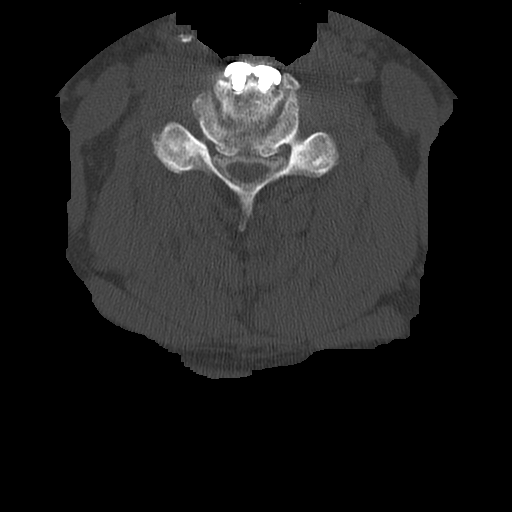

[Series 200: coronal · coronal · 0.51mm/px · 3 of 53 slices shown]
[im 11/53  bone]
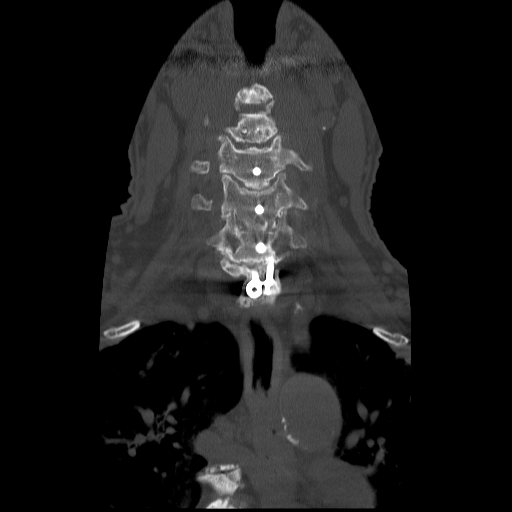
[im 21/53  bone]
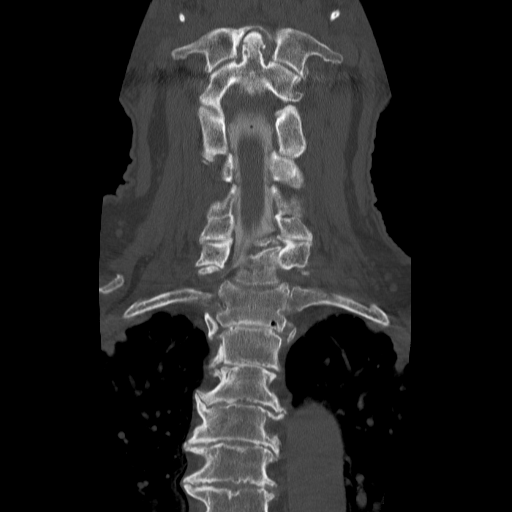
[im 32/53  bone]
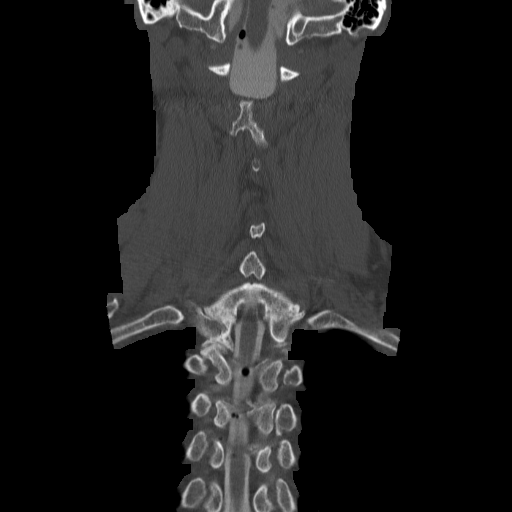

[Series 201: sagittal · sagittal · 0.51mm/px · 5 of 53 slices shown, 6 images]
[im 18/53  bone]
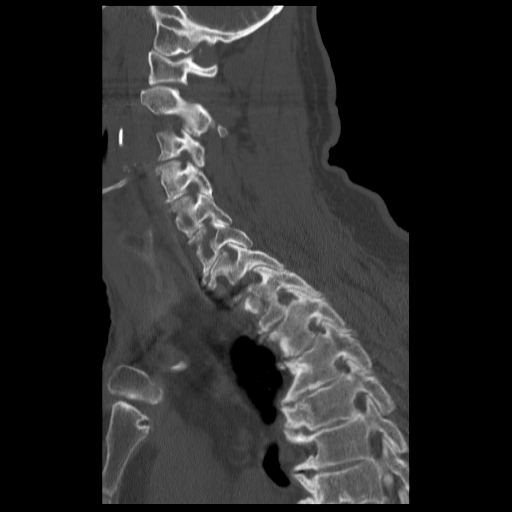
[im 22/53  bone]
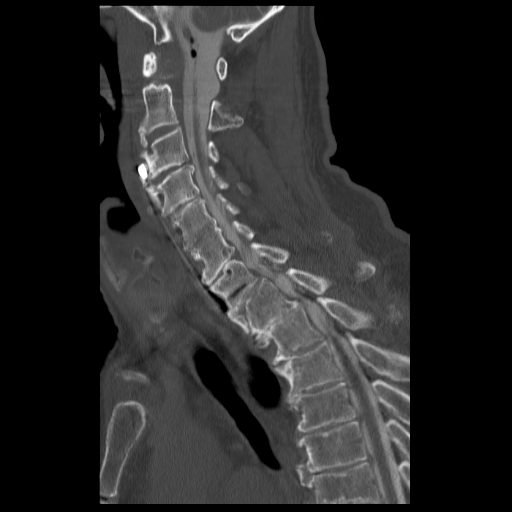
[im 27/53  soft-tissue]
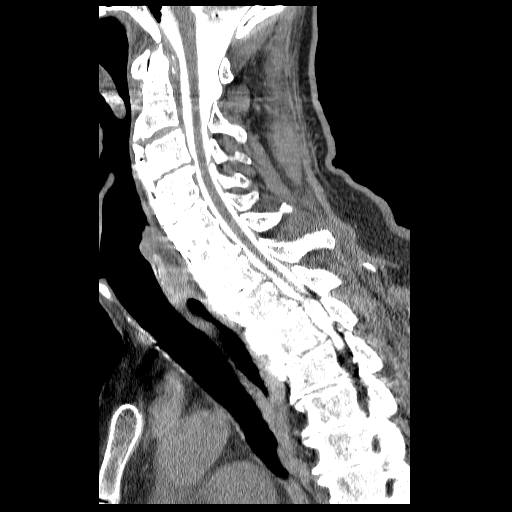
[im 27/53  bone]
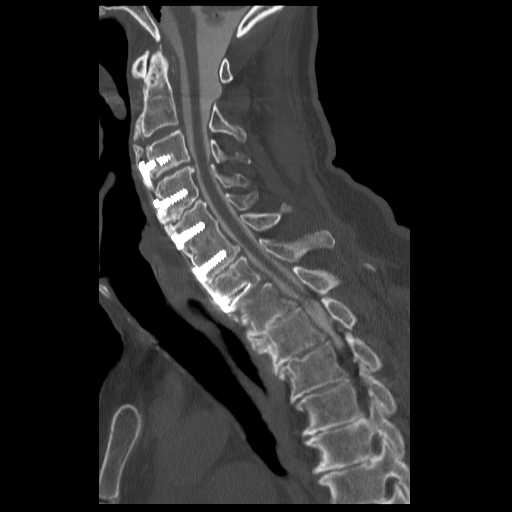
[im 31/53  bone]
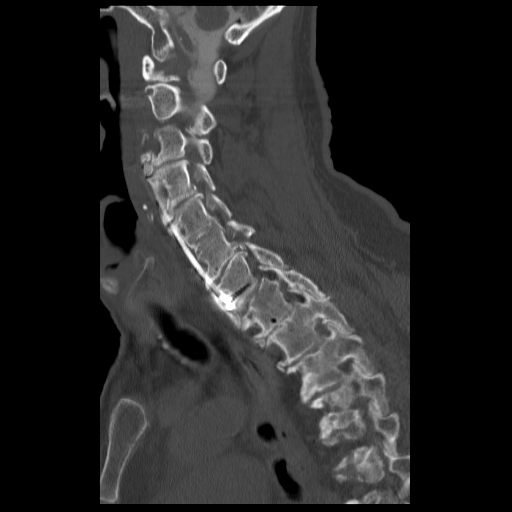
[im 35/53  bone]
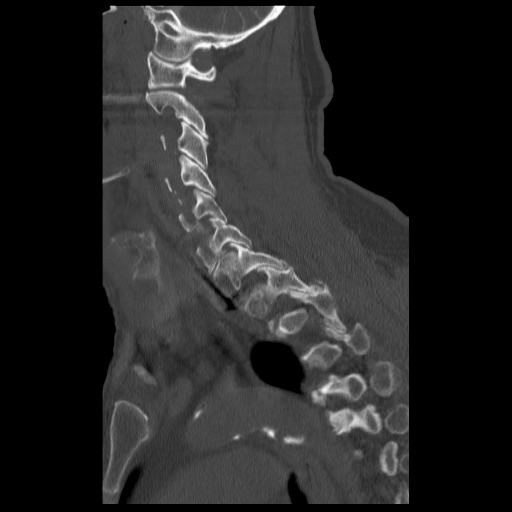

[Series 202: angled axial · axial · 0.51mm/px · z∈[-29,+134]mm · 4 of 148 slices shown, 5 images]
[im 30/148  soft-tissue]
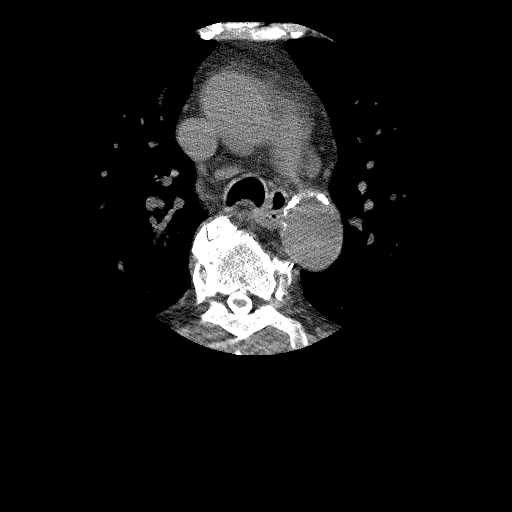
[im 30/148  bone]
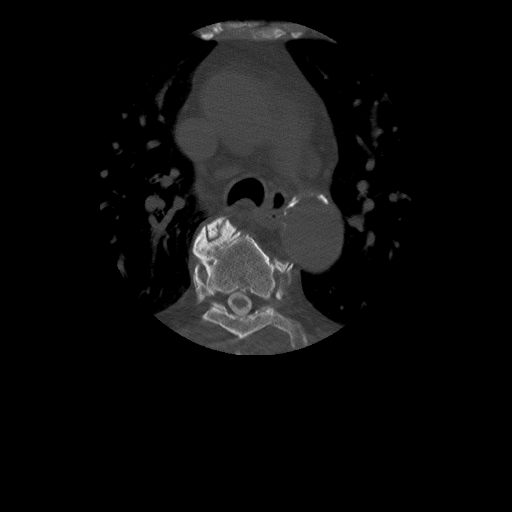
[im 59/148  bone]
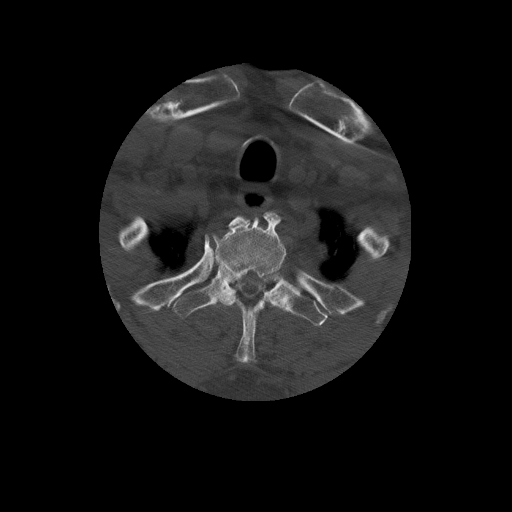
[im 89/148  bone]
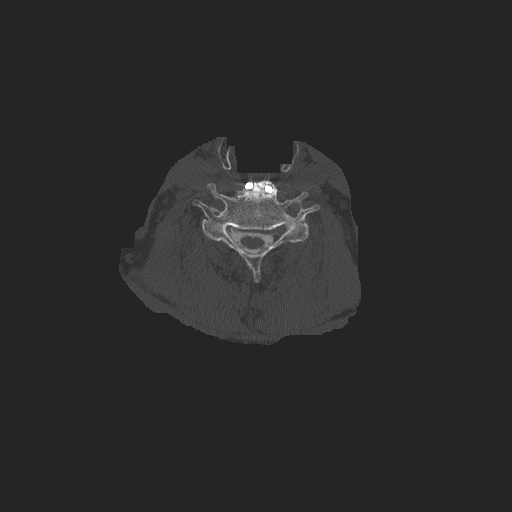
[im 118/148  bone]
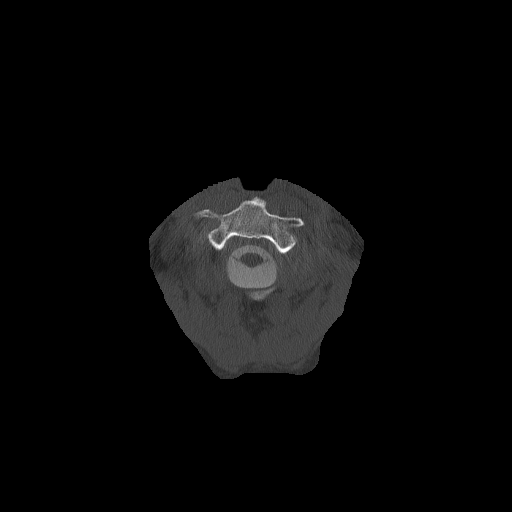

[14 of 33 positions shown; findings below may reference images not displayed]

FLUOROSCOPY TIME:  5 minutes corresponding to a Dose Area Product of
62 dGy*cm2

PROCEDURE:
LUMBAR PUNCTURE FOR CERVICAL MYELOGRAM

After thorough discussion of risks and benefits of the procedure
including bleeding, infection, injury to nerves, blood vessels,
adjacent structures as well as headache and CSF leak, written and
oral informed consent was obtained. Consent was obtained by Dr. FREDDY OSCAR
FREDDY OSCAR. We discussed the high likelihood of obtaining a diagnostic
study.

Patient was positioned prone on the fluoroscopy table. Local
anesthesia was provided with 1% lidocaine without epinephrine after
prepped and draped in the usual sterile fashion. Puncture was
performed at L4-5 initially using a 3 1/2 inch 22-gauge spinal
needle via LEFT paramedian approach. Due to severe spondylosis, the
contrast did not pass cephalad beyond the L3-4 disc space. This
approach was therefore band. Additional puncture was performed on
the LEFT at L3. Significant lumbar spondylosis was present with
stenosis at L1-2, L2-3, and L3-4,, but there was adequate contrast
which passed cephalad. Using a single pass through the dura, the
needle was placed within the thecal sac, with return of clear CSF.
10 mL of [J4] was injected into the thecal sac, with normal
opacification of the nerve roots and cauda equina consistent with
free flow within the subarachnoid space. A small amount of air was
added to promote cephalad migration. The patient was then moved to
the trendelenburg position and contrast flowed into the Cervical
spine region.

I personally performed the lumbar puncture and administered the
intrathecal contrast. I also personally supervised acquisition of
the myelogram images.
FINDINGS: CERVICAL MYELOGRAM FINDINGS:

The was fair opacification of the cervical subarachnoid space for
myelography, much better on CT. There has been previous ACDF C3-C7
with anterior plating. Incomplete fusion across the C3-4, C4-5, and
C6-7 interspaces is noted. The C5-6 fusion is solid. Both C3 screws
are fractured. The midline C4 screw is fractured.

CT CERVICAL MYELOGRAM FINDINGS:

Alignment: Trace anterolisthesis C2-3 representing adjacent segment
disease. Trace anterolisthesis C7-T1 and T1-2 also compensatory.

Vertebrae: No worrisome osseous lesion.

Cord: Moderate stenosis at T1 tube with slight effacement anterior
subarachnoid space and posterior cord displacement but no frank cord
compression.

Posterior Fossa: No tonsillar herniation

Paraspinal tissues: No masses. BILATERAL carotid calcification. Lung
apices clear.

Disc levels:

The individual disc spaces were examined as follows:

C2-3:  Mild bulge.  BILATERAL facet arthropathy.  No impingement.

C3-4: Pseudarthrosis. Adequate foraminal and central canal
decompression. No impingement.

C4-5: Pseudarthrosis. Adequate foraminal and central canal
decompression. No impingement

C5-6:  Solid fusion.  No impingement.

C6-7: Pseudarthrosis. Residual uncinate spurring on the LEFT narrows
the foramen compressing the LEFT C7 nerve root. Moderate neural
foraminal narrowing on the RIGHT potentially affects the RIGHT C7
nerve root as well.

C7-T1: Severe facet arthropathy. Anterior spurring. Shallow
subligamentous protrusion is partially calcified. This is minimally
eccentric to the LEFT. 2 mm facet mediated slip. Mild bulge. No
RIGHT or LEFT-sided neural impingement is established new new

T1-T2: Central disc osteophyte complex with superimposed central
extrusion. Slight cephalad migration. Slight effacement anterior
subarachnoid space without frank cord compression. Neural foramina
appear patent.

T2-T3: Moderate posterior element hypertrophy. No significant canal
stenosis. No disc protrusion. No definite impingement.

Compared with prior MR, good general agreement.
IMPRESSION: Status post C3-C7 ACDF. Pseudarthrosis at C3-4, C4-5, and C6-7.
Hardware failure with screw fracture BILATERAL at C3 and midline C4.

Residual uncinate spurring at C6-7, LEFT much greater than RIGHT.

Severe facet arthropathy at C7-T1 with 2 mm of slip. No impingement
at this level.

Moderately prominent central extrusion at T1-2 with effacement
anterior subarachnoid space but no significant cord flattening or
neural impingement.

## 2015-02-14 MED ORDER — ONDANSETRON HCL 4 MG/2ML IJ SOLN
4.0000 mg | Freq: Once | INTRAMUSCULAR | Status: AC
  Administered 2015-02-14: 4 mg via INTRAMUSCULAR

## 2015-02-14 MED ORDER — DIAZEPAM 5 MG PO TABS
5.0000 mg | ORAL_TABLET | Freq: Once | ORAL | Status: AC
  Administered 2015-02-14: 5 mg via ORAL

## 2015-02-14 MED ORDER — HYDROMORPHONE HCL 2 MG/ML IJ SOLN
2.0000 mg | Freq: Once | INTRAMUSCULAR | Status: AC
  Administered 2015-02-14: 2 mg via INTRAMUSCULAR

## 2015-02-14 MED ORDER — IOHEXOL 300 MG/ML  SOLN
10.0000 mL | Freq: Once | INTRAMUSCULAR | Status: AC | PRN
  Administered 2015-02-14: 10 mL via INTRATHECAL

## 2015-02-21 ENCOUNTER — Encounter (INDEPENDENT_AMBULATORY_CARE_PROVIDER_SITE_OTHER): Payer: Medicare HMO | Admitting: Ophthalmology

## 2015-02-21 DIAGNOSIS — H3531 Nonexudative age-related macular degeneration: Secondary | ICD-10-CM | POA: Diagnosis not present

## 2015-02-21 DIAGNOSIS — H2513 Age-related nuclear cataract, bilateral: Secondary | ICD-10-CM | POA: Diagnosis not present

## 2015-02-21 DIAGNOSIS — I10 Essential (primary) hypertension: Secondary | ICD-10-CM

## 2015-02-21 DIAGNOSIS — H3532 Exudative age-related macular degeneration: Secondary | ICD-10-CM

## 2015-02-21 DIAGNOSIS — H35033 Hypertensive retinopathy, bilateral: Secondary | ICD-10-CM

## 2015-02-21 DIAGNOSIS — H43813 Vitreous degeneration, bilateral: Secondary | ICD-10-CM | POA: Diagnosis not present

## 2015-03-28 ENCOUNTER — Encounter (INDEPENDENT_AMBULATORY_CARE_PROVIDER_SITE_OTHER): Payer: Medicare HMO | Admitting: Ophthalmology

## 2015-03-28 DIAGNOSIS — H35033 Hypertensive retinopathy, bilateral: Secondary | ICD-10-CM

## 2015-03-28 DIAGNOSIS — H3532 Exudative age-related macular degeneration: Secondary | ICD-10-CM | POA: Diagnosis not present

## 2015-03-28 DIAGNOSIS — I10 Essential (primary) hypertension: Secondary | ICD-10-CM | POA: Diagnosis not present

## 2015-03-28 DIAGNOSIS — H2513 Age-related nuclear cataract, bilateral: Secondary | ICD-10-CM | POA: Diagnosis not present

## 2015-03-28 DIAGNOSIS — H43813 Vitreous degeneration, bilateral: Secondary | ICD-10-CM | POA: Diagnosis not present

## 2015-03-28 DIAGNOSIS — H3531 Nonexudative age-related macular degeneration: Secondary | ICD-10-CM | POA: Diagnosis not present

## 2015-05-02 ENCOUNTER — Encounter (INDEPENDENT_AMBULATORY_CARE_PROVIDER_SITE_OTHER): Payer: Medicare HMO | Admitting: Ophthalmology

## 2015-05-02 DIAGNOSIS — H35033 Hypertensive retinopathy, bilateral: Secondary | ICD-10-CM

## 2015-05-02 DIAGNOSIS — I10 Essential (primary) hypertension: Secondary | ICD-10-CM | POA: Diagnosis not present

## 2015-05-02 DIAGNOSIS — H43813 Vitreous degeneration, bilateral: Secondary | ICD-10-CM | POA: Diagnosis not present

## 2015-05-02 DIAGNOSIS — H3532 Exudative age-related macular degeneration: Secondary | ICD-10-CM

## 2015-05-02 DIAGNOSIS — H3531 Nonexudative age-related macular degeneration: Secondary | ICD-10-CM

## 2015-06-07 ENCOUNTER — Encounter (INDEPENDENT_AMBULATORY_CARE_PROVIDER_SITE_OTHER): Payer: Medicare PPO | Admitting: Ophthalmology

## 2015-06-07 DIAGNOSIS — H3532 Exudative age-related macular degeneration: Secondary | ICD-10-CM

## 2015-06-07 DIAGNOSIS — H3531 Nonexudative age-related macular degeneration: Secondary | ICD-10-CM

## 2015-06-07 DIAGNOSIS — H2513 Age-related nuclear cataract, bilateral: Secondary | ICD-10-CM

## 2015-06-07 DIAGNOSIS — H35033 Hypertensive retinopathy, bilateral: Secondary | ICD-10-CM

## 2015-06-07 DIAGNOSIS — H43813 Vitreous degeneration, bilateral: Secondary | ICD-10-CM

## 2015-06-07 DIAGNOSIS — I10 Essential (primary) hypertension: Secondary | ICD-10-CM

## 2015-07-12 ENCOUNTER — Encounter (INDEPENDENT_AMBULATORY_CARE_PROVIDER_SITE_OTHER): Payer: Medicare PPO | Admitting: Ophthalmology

## 2015-07-12 DIAGNOSIS — H3531 Nonexudative age-related macular degeneration: Secondary | ICD-10-CM | POA: Diagnosis not present

## 2015-07-12 DIAGNOSIS — I10 Essential (primary) hypertension: Secondary | ICD-10-CM | POA: Diagnosis not present

## 2015-07-12 DIAGNOSIS — H2513 Age-related nuclear cataract, bilateral: Secondary | ICD-10-CM

## 2015-07-12 DIAGNOSIS — H3532 Exudative age-related macular degeneration: Secondary | ICD-10-CM | POA: Diagnosis not present

## 2015-07-12 DIAGNOSIS — H43813 Vitreous degeneration, bilateral: Secondary | ICD-10-CM | POA: Diagnosis not present

## 2015-07-12 DIAGNOSIS — H35033 Hypertensive retinopathy, bilateral: Secondary | ICD-10-CM

## 2015-08-23 ENCOUNTER — Encounter (INDEPENDENT_AMBULATORY_CARE_PROVIDER_SITE_OTHER): Payer: Medicare PPO | Admitting: Ophthalmology

## 2015-08-23 DIAGNOSIS — H43813 Vitreous degeneration, bilateral: Secondary | ICD-10-CM | POA: Diagnosis not present

## 2015-08-23 DIAGNOSIS — H35033 Hypertensive retinopathy, bilateral: Secondary | ICD-10-CM

## 2015-08-23 DIAGNOSIS — H3531 Nonexudative age-related macular degeneration: Secondary | ICD-10-CM

## 2015-08-23 DIAGNOSIS — I10 Essential (primary) hypertension: Secondary | ICD-10-CM

## 2015-08-23 DIAGNOSIS — H3532 Exudative age-related macular degeneration: Secondary | ICD-10-CM

## 2015-09-21 ENCOUNTER — Other Ambulatory Visit: Payer: Self-pay | Admitting: Orthopedic Surgery

## 2015-10-05 ENCOUNTER — Encounter (INDEPENDENT_AMBULATORY_CARE_PROVIDER_SITE_OTHER): Payer: Medicare PPO | Admitting: Ophthalmology

## 2015-10-05 DIAGNOSIS — H2513 Age-related nuclear cataract, bilateral: Secondary | ICD-10-CM | POA: Diagnosis not present

## 2015-10-05 DIAGNOSIS — I10 Essential (primary) hypertension: Secondary | ICD-10-CM

## 2015-10-05 DIAGNOSIS — H353221 Exudative age-related macular degeneration, left eye, with active choroidal neovascularization: Secondary | ICD-10-CM | POA: Diagnosis not present

## 2015-10-05 DIAGNOSIS — H35033 Hypertensive retinopathy, bilateral: Secondary | ICD-10-CM | POA: Diagnosis not present

## 2015-10-05 DIAGNOSIS — H43813 Vitreous degeneration, bilateral: Secondary | ICD-10-CM | POA: Diagnosis not present

## 2015-10-05 DIAGNOSIS — H353114 Nonexudative age-related macular degeneration, right eye, advanced atrophic with subfoveal involvement: Secondary | ICD-10-CM | POA: Diagnosis not present

## 2015-10-20 ENCOUNTER — Encounter (HOSPITAL_BASED_OUTPATIENT_CLINIC_OR_DEPARTMENT_OTHER): Payer: Self-pay | Admitting: *Deleted

## 2015-10-24 ENCOUNTER — Other Ambulatory Visit: Payer: Self-pay

## 2015-10-24 ENCOUNTER — Encounter (HOSPITAL_BASED_OUTPATIENT_CLINIC_OR_DEPARTMENT_OTHER)
Admission: RE | Admit: 2015-10-24 | Discharge: 2015-10-24 | Disposition: A | Discharge status: Home / Self Care | Payer: Medicare PPO | Source: Ambulatory Visit | Attending: Orthopedic Surgery | Admitting: Orthopedic Surgery

## 2015-10-24 DIAGNOSIS — Z01818 Encounter for other preprocedural examination: Secondary | ICD-10-CM | POA: Diagnosis present

## 2015-10-24 DIAGNOSIS — M19012 Primary osteoarthritis, left shoulder: Secondary | ICD-10-CM | POA: Insufficient documentation

## 2015-10-24 DIAGNOSIS — Z96612 Presence of left artificial shoulder joint: Secondary | ICD-10-CM | POA: Diagnosis not present

## 2015-10-24 DIAGNOSIS — F418 Other specified anxiety disorders: Secondary | ICD-10-CM | POA: Diagnosis not present

## 2015-10-24 DIAGNOSIS — I10 Essential (primary) hypertension: Secondary | ICD-10-CM | POA: Diagnosis not present

## 2015-10-24 DIAGNOSIS — M19011 Primary osteoarthritis, right shoulder: Secondary | ICD-10-CM | POA: Diagnosis not present

## 2015-10-24 LAB — SURGICAL PCR SCREEN
MRSA, PCR: NEGATIVE
Staphylococcus aureus: NEGATIVE

## 2015-10-24 LAB — BASIC METABOLIC PANEL
Anion gap: 8 (ref 5–15)
BUN: 35 mg/dL — AB (ref 6–20)
CHLORIDE: 106 mmol/L (ref 101–111)
CO2: 23 mmol/L (ref 22–32)
Calcium: 9.6 mg/dL (ref 8.9–10.3)
Creatinine, Ser: 0.95 mg/dL (ref 0.61–1.24)
GFR calc Af Amer: >60 mL/min (ref >60)
GFR calc non Af Amer: >60 mL/min (ref >60)
Glucose, Bld: 165 mg/dL — ABNORMAL HIGH (ref 65–99)
POTASSIUM: 4.8 mmol/L (ref 3.5–5.1)
SODIUM: 137 mmol/L (ref 135–145)

## 2015-10-24 LAB — CBC
HEMATOCRIT: 40.8 % (ref 39.0–52.0)
HEMOGLOBIN: 13.2 g/dL (ref 13.0–17.0)
MCH: 30.4 pg (ref 26.0–34.0)
MCHC: 32.4 g/dL (ref 30.0–36.0)
MCV: 94 fL (ref 78.0–100.0)
Platelets: 239 10*3/uL (ref 150–400)
RBC: 4.34 MIL/uL (ref 4.22–5.81)
RDW: 12.4 % (ref 11.5–15.5)
WBC: 6.6 10*3/uL (ref 4.0–10.5)

## 2015-11-03 ENCOUNTER — Ambulatory Visit (HOSPITAL_COMMUNITY): Payer: Medicare PPO

## 2015-11-03 ENCOUNTER — Ambulatory Visit (HOSPITAL_BASED_OUTPATIENT_CLINIC_OR_DEPARTMENT_OTHER): Payer: Medicare PPO | Admitting: Certified Registered"

## 2015-11-03 ENCOUNTER — Ambulatory Visit (HOSPITAL_BASED_OUTPATIENT_CLINIC_OR_DEPARTMENT_OTHER)
Admission: RE | Admit: 2015-11-03 | Discharge: 2015-11-04 | Disposition: A | Discharge status: Home / Self Care | Payer: Medicare PPO | Source: Ambulatory Visit | Attending: Orthopedic Surgery | Admitting: Orthopedic Surgery

## 2015-11-03 ENCOUNTER — Encounter (HOSPITAL_BASED_OUTPATIENT_CLINIC_OR_DEPARTMENT_OTHER)
Admission: RE | Disposition: A | Discharge status: Home / Self Care | Payer: Self-pay | Source: Ambulatory Visit | Attending: Orthopedic Surgery

## 2015-11-03 ENCOUNTER — Encounter (HOSPITAL_BASED_OUTPATIENT_CLINIC_OR_DEPARTMENT_OTHER): Payer: Self-pay | Admitting: Anesthesiology

## 2015-11-03 DIAGNOSIS — F418 Other specified anxiety disorders: Secondary | ICD-10-CM | POA: Diagnosis not present

## 2015-11-03 DIAGNOSIS — Z96612 Presence of left artificial shoulder joint: Secondary | ICD-10-CM | POA: Diagnosis not present

## 2015-11-03 DIAGNOSIS — M19011 Primary osteoarthritis, right shoulder: Secondary | ICD-10-CM | POA: Insufficient documentation

## 2015-11-03 DIAGNOSIS — Z96611 Presence of right artificial shoulder joint: Secondary | ICD-10-CM

## 2015-11-03 DIAGNOSIS — I10 Essential (primary) hypertension: Secondary | ICD-10-CM | POA: Diagnosis not present

## 2015-11-03 DIAGNOSIS — Z96619 Presence of unspecified artificial shoulder joint: Secondary | ICD-10-CM

## 2015-11-03 HISTORY — DX: Primary osteoarthritis, right shoulder: M19.011

## 2015-11-03 HISTORY — DX: Anxiety disorder, unspecified: F41.9

## 2015-11-03 HISTORY — PX: TOTAL SHOULDER ARTHROPLASTY: SHX126

## 2015-11-03 IMAGING — CR DG SHOULDER 2+V PORT*R*
1 series · 1 of 1 positions shown · non-contrast
Comparison: None.

CLINICAL DATA: Postoperative portable view from right shoulder
replacement

EXAM:
PORTABLE RIGHT SHOULDER -1 VIEW

[AP]
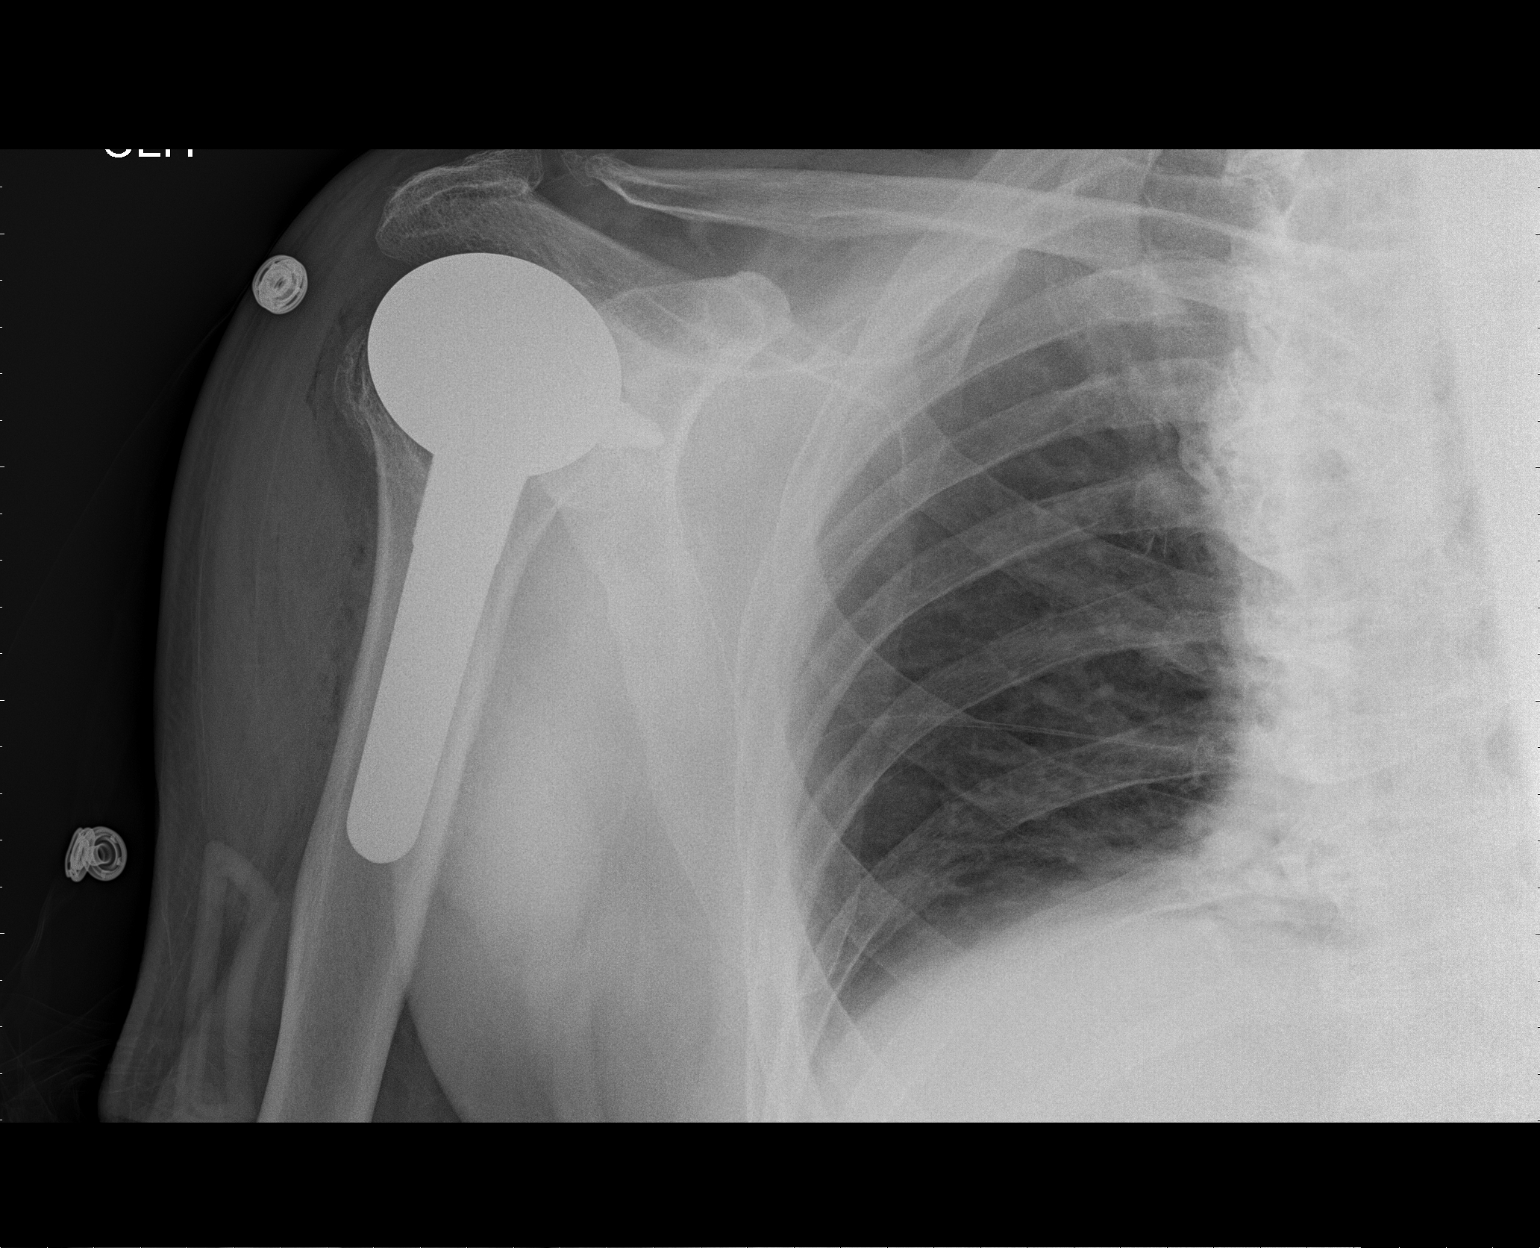

[1 of 1 positions shown; findings below may reference images not displayed]

FINDINGS: The patient has undergone placement of a right shoulder joint
prosthesis. Radiographic position of the prosthetic component
appears appropriate on this single view. There is a small amount of
soft tissue gas. There are degenerative changes of the AC joint.
IMPRESSION: There is no evidence of immediate postprocedure complication
following right shoulder joint replacement.

## 2015-11-03 SURGERY — ARTHROPLASTY, SHOULDER, TOTAL
Anesthesia: General | Site: Shoulder | Laterality: Right

## 2015-11-03 MED ORDER — HYDROMORPHONE HCL 1 MG/ML IJ SOLN
INTRAMUSCULAR | Status: AC
  Filled 2015-11-03: qty 1

## 2015-11-03 MED ORDER — ONDANSETRON HCL 4 MG PO TABS: 4.0000 mg | ORAL_TABLET | Freq: Four times a day (QID) | ORAL | Status: DC | PRN

## 2015-11-03 MED ORDER — FENTANYL CITRATE (PF) 100 MCG/2ML IJ SOLN
INTRAMUSCULAR | Status: AC
  Filled 2015-11-03: qty 2

## 2015-11-03 MED ORDER — SCOPOLAMINE 1 MG/3DAYS TD PT72
MEDICATED_PATCH | TRANSDERMAL | Status: AC
Start: 2015-11-03 — End: 2015-11-03
  Filled 2015-11-03: qty 1

## 2015-11-03 MED ORDER — PROPOFOL 10 MG/ML IV BOLUS
INTRAVENOUS | Status: DC | PRN
  Administered 2015-11-03: 30 mg via INTRAVENOUS
  Administered 2015-11-03: 50 mg via INTRAVENOUS
  Administered 2015-11-03: 100 mg via INTRAVENOUS
  Administered 2015-11-03: 50 mg via INTRAVENOUS

## 2015-11-03 MED ORDER — MIDAZOLAM HCL 2 MG/2ML IJ SOLN
INTRAMUSCULAR | Status: AC
  Filled 2015-11-03: qty 2

## 2015-11-03 MED ORDER — FENTANYL CITRATE (PF) 100 MCG/2ML IJ SOLN
50.0000 ug | INTRAMUSCULAR | Status: DC | PRN
  Administered 2015-11-03: 100 ug via INTRAVENOUS

## 2015-11-03 MED ORDER — HYDROMORPHONE HCL 2 MG PO TABS: 2.0000 mg | ORAL_TABLET | ORAL | Status: DC | PRN

## 2015-11-03 MED ORDER — LOSARTAN POTASSIUM 50 MG PO TABS: 100.0000 mg | ORAL_TABLET | Freq: Every day | ORAL | Status: DC

## 2015-11-03 MED ORDER — DEXAMETHASONE SODIUM PHOSPHATE 4 MG/ML IJ SOLN
INTRAMUSCULAR | Status: DC | PRN
  Administered 2015-11-03: 10 mg via INTRAVENOUS

## 2015-11-03 MED ORDER — PROPOFOL 10 MG/ML IV BOLUS
INTRAVENOUS | Status: AC
  Filled 2015-11-03: qty 40

## 2015-11-03 MED ORDER — LACTATED RINGERS IV SOLN
INTRAVENOUS | Status: DC
  Administered 2015-11-03 (×3): via INTRAVENOUS

## 2015-11-03 MED ORDER — HYDROMORPHONE HCL 1 MG/ML IJ SOLN
0.2500 mg | INTRAMUSCULAR | Status: DC | PRN
  Administered 2015-11-03 (×2): 0.5 mg via INTRAVENOUS

## 2015-11-03 MED ORDER — PHENYLEPHRINE HCL 10 MG/ML IJ SOLN
INTRAMUSCULAR | Status: AC
  Filled 2015-11-03: qty 1

## 2015-11-03 MED ORDER — SENNA-DOCUSATE SODIUM 8.6-50 MG PO TABS: 2.0000 | ORAL_TABLET | Freq: Every day | ORAL | Status: DC

## 2015-11-03 MED ORDER — ZOLPIDEM TARTRATE 5 MG PO TABS: 5.0000 mg | ORAL_TABLET | Freq: Every evening | ORAL | Status: DC | PRN

## 2015-11-03 MED ORDER — BISACODYL 10 MG RE SUPP: 10.0000 mg | Freq: Every day | RECTAL | Status: DC | PRN

## 2015-11-03 MED ORDER — DIAZEPAM 5 MG PO TABS: 5.0000 mg | ORAL_TABLET | Freq: Four times a day (QID) | ORAL | Status: DC | PRN

## 2015-11-03 MED ORDER — ONDANSETRON HCL 4 MG/2ML IJ SOLN: 4.0000 mg | Freq: Four times a day (QID) | INTRAMUSCULAR | Status: DC | PRN

## 2015-11-03 MED ORDER — METOCLOPRAMIDE HCL 5 MG PO TABS: 5.0000 mg | ORAL_TABLET | Freq: Three times a day (TID) | ORAL | Status: DC | PRN

## 2015-11-03 MED ORDER — ONDANSETRON HCL 4 MG/2ML IJ SOLN
INTRAMUSCULAR | Status: DC | PRN
  Administered 2015-11-03: 4 mg via INTRAVENOUS

## 2015-11-03 MED ORDER — OXYCODONE-ACETAMINOPHEN 10-325 MG PO TABS: 1.0000 | ORAL_TABLET | Freq: Four times a day (QID) | ORAL | Status: DC | PRN

## 2015-11-03 MED ORDER — DOCUSATE SODIUM 100 MG PO CAPS
100.0000 mg | ORAL_CAPSULE | Freq: Two times a day (BID) | ORAL | Status: DC
  Administered 2015-11-03: 100 mg via ORAL
  Filled 2015-11-03: qty 1

## 2015-11-03 MED ORDER — METHOCARBAMOL 1000 MG/10ML IJ SOLN: 500.0000 mg | Freq: Four times a day (QID) | INTRAMUSCULAR | Status: DC | PRN

## 2015-11-03 MED ORDER — ONDANSETRON HCL 4 MG PO TABS: 4.0000 mg | ORAL_TABLET | Freq: Three times a day (TID) | ORAL | Status: DC | PRN

## 2015-11-03 MED ORDER — CEFAZOLIN SODIUM 1-5 GM-% IV SOLN
1.0000 g | Freq: Four times a day (QID) | INTRAVENOUS | Status: AC
  Administered 2015-11-03 – 2015-11-04 (×3): 1 g via INTRAVENOUS
  Filled 2015-11-03 (×3): qty 50

## 2015-11-03 MED ORDER — EPHEDRINE SULFATE 50 MG/ML IJ SOLN
INTRAMUSCULAR | Status: DC | PRN
  Administered 2015-11-03 (×2): 10 mg via INTRAVENOUS

## 2015-11-03 MED ORDER — ZOLPIDEM TARTRATE 10 MG PO TABS
10.0000 mg | ORAL_TABLET | Freq: Every day | ORAL | Status: DC
  Administered 2015-11-03: 10 mg via ORAL

## 2015-11-03 MED ORDER — BUPIVACAINE-EPINEPHRINE (PF) 0.5% -1:200000 IJ SOLN
INTRAMUSCULAR | Status: DC | PRN
  Administered 2015-11-03: 30 mL via PERINEURAL

## 2015-11-03 MED ORDER — FENTANYL CITRATE (PF) 100 MCG/2ML IJ SOLN
INTRAMUSCULAR | Status: DC | PRN
  Administered 2015-11-03: 100 ug via INTRAVENOUS
  Administered 2015-11-03: 25 ug via INTRAVENOUS

## 2015-11-03 MED ORDER — METHOCARBAMOL 500 MG PO TABS
500.0000 mg | ORAL_TABLET | Freq: Four times a day (QID) | ORAL | Status: DC | PRN
  Administered 2015-11-03: 500 mg via ORAL
  Filled 2015-11-03: qty 1

## 2015-11-03 MED ORDER — HYDROMORPHONE HCL 1 MG/ML IJ SOLN
0.5000 mg | INTRAMUSCULAR | Status: DC | PRN
  Administered 2015-11-03 – 2015-11-04 (×4): 1 mg via INTRAVENOUS
  Filled 2015-11-03 (×4): qty 1

## 2015-11-03 MED ORDER — PHENYLEPHRINE HCL 10 MG/ML IJ SOLN
INTRAMUSCULAR | Status: DC | PRN
  Administered 2015-11-03 (×8): 80 ug via INTRAVENOUS
  Administered 2015-11-03: 20 ug via INTRAVENOUS
  Administered 2015-11-03: 80 ug via INTRAVENOUS

## 2015-11-03 MED ORDER — LIDOCAINE HCL (CARDIAC) 20 MG/ML IV SOLN
INTRAVENOUS | Status: AC
  Filled 2015-11-03: qty 5

## 2015-11-03 MED ORDER — SUCCINYLCHOLINE CHLORIDE 20 MG/ML IJ SOLN
INTRAMUSCULAR | Status: DC | PRN
  Administered 2015-11-03: 50 mg via INTRAVENOUS

## 2015-11-03 MED ORDER — OXYCODONE HCL 5 MG PO TABS
5.0000 mg | ORAL_TABLET | ORAL | Status: DC | PRN
  Administered 2015-11-03: 10 mg via ORAL
  Filled 2015-11-03: qty 2

## 2015-11-03 MED ORDER — SCOPOLAMINE 1 MG/3DAYS TD PT72
1.0000 | MEDICATED_PATCH | Freq: Once | TRANSDERMAL | Status: DC | PRN
  Administered 2015-11-03: 1.5 mg via TRANSDERMAL

## 2015-11-03 MED ORDER — METOCLOPRAMIDE HCL 5 MG/ML IJ SOLN: 5.0000 mg | Freq: Three times a day (TID) | INTRAMUSCULAR | Status: DC | PRN

## 2015-11-03 MED ORDER — SENNA 8.6 MG PO TABS
1.0000 | ORAL_TABLET | Freq: Two times a day (BID) | ORAL | Status: DC
  Administered 2015-11-03: 8.6 mg via ORAL
  Filled 2015-11-03: qty 1

## 2015-11-03 MED ORDER — GLYCOPYRROLATE 0.2 MG/ML IJ SOLN: 0.2000 mg | Freq: Once | INTRAMUSCULAR | Status: DC | PRN

## 2015-11-03 MED ORDER — ONDANSETRON HCL 4 MG/2ML IJ SOLN
INTRAMUSCULAR | Status: AC
  Filled 2015-11-03: qty 2

## 2015-11-03 MED ORDER — SODIUM CHLORIDE 0.9 % IV SOLN
INTRAVENOUS | Status: DC
  Administered 2015-11-03: 14:00:00 via INTRAVENOUS

## 2015-11-03 MED ORDER — CEFAZOLIN SODIUM-DEXTROSE 2-3 GM-% IV SOLR
2.0000 g | INTRAVENOUS | Status: AC
  Administered 2015-11-03: 2 g via INTRAVENOUS

## 2015-11-03 MED ORDER — PROMETHAZINE HCL 25 MG/ML IJ SOLN: 6.2500 mg | INTRAMUSCULAR | Status: DC | PRN

## 2015-11-03 MED ORDER — DEXAMETHASONE SODIUM PHOSPHATE 10 MG/ML IJ SOLN
INTRAMUSCULAR | Status: AC
  Filled 2015-11-03: qty 1

## 2015-11-03 MED ORDER — MIDAZOLAM HCL 2 MG/2ML IJ SOLN
1.0000 mg | INTRAMUSCULAR | Status: DC | PRN
  Administered 2015-11-03: 2 mg via INTRAVENOUS

## 2015-11-03 MED ORDER — TAMSULOSIN HCL 0.4 MG PO CAPS: 0.4000 mg | ORAL_CAPSULE | Freq: Every day | ORAL | Status: DC

## 2015-11-03 MED ORDER — POLYETHYLENE GLYCOL 3350 17 G PO PACK: 17.0000 g | PACK | Freq: Every day | ORAL | Status: DC | PRN

## 2015-11-03 MED ORDER — MAGNESIUM CITRATE PO SOLN: 1.0000 | Freq: Once | ORAL | Status: DC | PRN

## 2015-11-03 MED ORDER — OXYCODONE-ACETAMINOPHEN 5-325 MG PO TABS
1.0000 | ORAL_TABLET | ORAL | Status: DC | PRN
  Administered 2015-11-03 – 2015-11-04 (×3): 2 via ORAL
  Filled 2015-11-03 (×3): qty 2

## 2015-11-03 MED ORDER — AMLODIPINE BESYLATE 5 MG PO TABS: 5.0000 mg | ORAL_TABLET | Freq: Every day | ORAL | Status: DC

## 2015-11-03 MED ORDER — ATORVASTATIN CALCIUM 10 MG PO TABS: 10.0000 mg | ORAL_TABLET | Freq: Every day | ORAL | Status: DC

## 2015-11-03 SURGICAL SUPPLY — 77 items
3,2MM STEINMANN PIN ×1 IMPLANT
BLADE HEX COATED 2.75 (ELECTRODE) ×3 IMPLANT
BLADE SAGITTAL 25.0X1.27X90 (BLADE) IMPLANT
BLADE SAGITTAL 25.0X1.27X90MM (BLADE)
BLADE SAW SAG 29X58X.64 (BLADE) ×3 IMPLANT
BLADE SURG 10 STRL SS (BLADE) ×3 IMPLANT
BLADE SURG 15 STRL LF DISP TIS (BLADE) ×2 IMPLANT
BLADE SURG 15 STRL SS (BLADE) ×6
CAPT SHLDR TOTAL 2 ×2 IMPLANT
CEMENT HV SMART SET (Cement) ×3 IMPLANT
CLOSURE STERI-STRIP 1/2X4 (GAUZE/BANDAGES/DRESSINGS) ×1
CLSR STERI-STRIP ANTIMIC 1/2X4 (GAUZE/BANDAGES/DRESSINGS) ×2 IMPLANT
COVER BACK TABLE 60X90IN (DRAPES) ×3 IMPLANT
COVER MAYO STAND STRL (DRAPES) ×3 IMPLANT
DECANTER SPIKE VIAL GLASS SM (MISCELLANEOUS) IMPLANT
DRAPE INCISE IOBAN 66X45 STRL (DRAPES) IMPLANT
DRAPE SURG 17X23 STRL (DRAPES) ×3 IMPLANT
DRAPE U 20/CS (DRAPES) ×3 IMPLANT
DRAPE U-SHAPE 47X51 STRL (DRAPES) ×3 IMPLANT
DRAPE U-SHAPE 76X120 STRL (DRAPES) ×6 IMPLANT
DRSG MEPILEX BORDER 4X8 (GAUZE/BANDAGES/DRESSINGS) ×3 IMPLANT
DURAPREP 26ML APPLICATOR (WOUND CARE) ×3 IMPLANT
ELECT BLADE 6.5 .24CM SHAFT (ELECTRODE) IMPLANT
ELECT REM PT RETURN 9FT ADLT (ELECTROSURGICAL) ×3
ELECTRODE REM PT RTRN 9FT ADLT (ELECTROSURGICAL) ×1 IMPLANT
FACESHIELD WRAPAROUND (MASK) ×6 IMPLANT
FACESHIELD WRAPAROUND OR TEAM (MASK) ×2 IMPLANT
GLOVE BIO SURGEON STRL SZ 6.5 (GLOVE) ×3 IMPLANT
GLOVE BIO SURGEON STRL SZ8 (GLOVE) ×3 IMPLANT
GLOVE BIO SURGEONS STRL SZ 6.5 (GLOVE) ×3
GLOVE BIOGEL PI IND STRL 7.0 (GLOVE) IMPLANT
GLOVE BIOGEL PI IND STRL 8 (GLOVE) ×2 IMPLANT
GLOVE BIOGEL PI INDICATOR 7.0 (GLOVE) ×6
GLOVE BIOGEL PI INDICATOR 8 (GLOVE) ×4
GLOVE ORTHO TXT STRL SZ7.5 (GLOVE) ×3 IMPLANT
GOWN STRL REUS W/ TWL LRG LVL3 (GOWN DISPOSABLE) ×2 IMPLANT
GOWN STRL REUS W/ TWL XL LVL3 (GOWN DISPOSABLE) ×2 IMPLANT
GOWN STRL REUS W/TWL LRG LVL3 (GOWN DISPOSABLE) ×6
GOWN STRL REUS W/TWL XL LVL3 (GOWN DISPOSABLE) ×6
HANDPIECE INTERPULSE COAX TIP (DISPOSABLE) ×3
NS IRRIG 1000ML POUR BTL (IV SOLUTION) ×3 IMPLANT
PACK ARTHROSCOPY DSU (CUSTOM PROCEDURE TRAY) ×3 IMPLANT
PACK BASIN DAY SURGERY FS (CUSTOM PROCEDURE TRAY) ×3 IMPLANT
PENCIL BUTTON HOLSTER BLD 10FT (ELECTRODE) ×3 IMPLANT
RETRIEVER SUT HEWSON (MISCELLANEOUS) IMPLANT
SET HNDPC FAN SPRY TIP SCT (DISPOSABLE) IMPLANT
SHEET MEDIUM DRAPE 40X70 STRL (DRAPES) ×3 IMPLANT
SLEEVE SCD COMPRESS KNEE MED (MISCELLANEOUS) ×3 IMPLANT
SLING ARM FOAM STRAP LRG (SOFTGOODS) IMPLANT
SLING ARM IMMOBILIZER LRG (SOFTGOODS) ×2 IMPLANT
SLING ARM IMMOBILIZER MED (SOFTGOODS) IMPLANT
SLING ARM MED ADULT FOAM STRAP (SOFTGOODS) IMPLANT
SLING ARM XL FOAM STRAP (SOFTGOODS) IMPLANT
SMARTMIX MINI TOWER (MISCELLANEOUS) ×6
SPONGE LAP 18X18 X RAY DECT (DISPOSABLE) ×3 IMPLANT
SPONGE LAP 4X18 X RAY DECT (DISPOSABLE) ×3 IMPLANT
STEINMANN PIN 1/8X2.5 ×2 IMPLANT
SUCTION FRAZIER TIP 10 FR DISP (SUCTIONS) ×3 IMPLANT
SUPPORT WRAP ARM LG (MISCELLANEOUS) ×3 IMPLANT
SUT FIBERWIRE #2 38 T-5 BLUE (SUTURE) ×6
SUT MNCRL AB 4-0 PS2 18 (SUTURE) IMPLANT
SUT VIC AB 0 CT1 18XCR BRD 8 (SUTURE) IMPLANT
SUT VIC AB 0 CT1 27 (SUTURE) ×3
SUT VIC AB 0 CT1 27XBRD ANBCTR (SUTURE) ×1 IMPLANT
SUT VIC AB 0 CT1 8-18 (SUTURE)
SUT VIC AB 2-0 SH 27 (SUTURE)
SUT VIC AB 2-0 SH 27XBRD (SUTURE) IMPLANT
SUT VICRYL 3-0 CR8 SH (SUTURE) ×3 IMPLANT
SUTURE FIBERWR #2 38 T-5 BLUE (SUTURE) ×2 IMPLANT
SYR BULB IRRIGATION 50ML (SYRINGE) ×3 IMPLANT
TAPE STRIPS DRAPE STRL (GAUZE/BANDAGES/DRESSINGS) IMPLANT
TOWEL OR 17X24 6PK STRL BLUE (TOWEL DISPOSABLE) ×6 IMPLANT
TOWEL OR NON WOVEN STRL DISP B (DISPOSABLE) ×6 IMPLANT
TOWER SMARTMIX MINI (MISCELLANEOUS) ×1 IMPLANT
TUBE CONNECTING 20'X1/4 (TUBING)
TUBE CONNECTING 20X1/4 (TUBING) IMPLANT
YANKAUER SUCT BULB TIP NO VENT (SUCTIONS) ×3 IMPLANT

## 2015-11-03 NOTE — Transfer of Care (Signed)
Immediate Anesthesia Transfer of Care Note  Patient: Mitchell Harris  Procedure(s) Performed: Procedure(s) with comments: RIGHT TOTAL SHOULDER ARTHROPLASTY (Right) - ANESTHESIA:  GENERAL, PRE/POST OP SCALENE  Patient Location: PACU  Anesthesia Type:GA combined with regional for post-op pain  Level of Consciousness: sedated and patient cooperative  Airway & Oxygen Therapy: Patient Spontanous Breathing and Patient connected to face mask oxygen  Post-op Assessment: Report given to RN and Post -op Vital signs reviewed and stable  Post vital signs: Reviewed and stable  Last Vitals:  Filed Vitals:   11/03/15 0900 11/03/15 0910  BP: 143/82 122/54  Pulse: 71 84  Temp:    Resp: 10 18    Complications: No apparent anesthesia complications

## 2015-11-03 NOTE — Anesthesia Postprocedure Evaluation (Signed)
Anesthesia Post Note  Patient: Mitchell Harris  Procedure(s) Performed: Procedure(s) (LRB): RIGHT TOTAL SHOULDER ARTHROPLASTY (Right)  Patient location during evaluation: PACU Anesthesia Type: General Level of consciousness: sedated Pain management: pain level controlled Vital Signs Assessment: post-procedure vital signs reviewed and stable Respiratory status: spontaneous breathing and respiratory function stable Cardiovascular status: stable Anesthetic complications: no    Last Vitals:  Filed Vitals:   11/03/15 1315 11/03/15 1334  BP: 116/68 123/66  Pulse: 83 88  Temp:  36.4 C  Resp: 13 18    Last Pain:  Filed Vitals:   11/03/15 1402  PainSc: 5                  Mitchell Harris

## 2015-11-03 NOTE — Anesthesia Procedure Notes (Addendum)
Anesthesia Regional Block:  Interscalene brachial plexus block  Pre-Anesthetic Checklist: ,, timeout performed, Correct Patient, Correct Site, Correct Laterality, Correct Procedure, Correct Position, site marked, Risks and benefits discussed,  Surgical consent,  Pre-op evaluation,  At surgeon's request and post-op pain management  Laterality: Right  Prep: chloraprep       Needles:  Injection technique: Single-shot  Needle Type: Echogenic Stimulator Needle     Needle Length: 5cm 5 cm Needle Gauge: 22 and 22 G    Additional Needles:  Procedures: ultrasound guided (picture in chart) and nerve stimulator Interscalene brachial plexus block  Nerve Stimulator or Paresthesia:  Response: bicep contraction, 0.45 mA,   Additional Responses:   Narrative:  Start time: 11/03/2015 8:56 AM End time: 11/03/2015 9:06 AM Injection made incrementally with aspirations every 5 mL.  Performed by: Personally  Anesthesiologist: Heather RobertsSINGER, JAMES  Additional Notes: Functioning IV was confirmed and monitors applied.  A 50mm 22ga echogenic arrow stimulator was used. Sterile prep and drape,hand hygiene and sterile gloves were used.Ultrasound guidance: relevant anatomy identified, needle position confirmed, local anesthetic spread visualized around nerve(s)., vascular puncture avoided.  Image printed for medical record.  Negative aspiration and negative test dose prior to incremental administration of local anesthetic. The patient tolerated the procedure well.   Procedure Name: Intubation Date/Time: 11/03/2015 9:51 AM Performed by: Chester DesanctisLINKA, Daveyon Kitchings L Pre-anesthesia Checklist: Patient identified, Emergency Drugs available, Suction available, Patient being monitored and Timeout performed Patient Re-evaluated:Patient Re-evaluated prior to inductionOxygen Delivery Method: Circle System Utilized Preoxygenation: Pre-oxygenation with 100% oxygen Intubation Type: IV induction Ventilation: Mask ventilation without  difficulty Laryngoscope Size: Glidescope and 3 Tube type: Oral Tube size: 8.0 mm Number of attempts: 1 Airway Equipment and Method: Stylet and Oral airway Placement Confirmation: ETT inserted through vocal cords under direct vision,  positive ETCO2 and breath sounds checked- equal and bilateral Secured at: 22 cm Tube secured with: Tape Dental Injury: Teeth and Oropharynx as per pre-operative assessment  Difficulty Due To: Difficulty was anticipated and Difficult Airway- due to reduced neck mobility Comments: Cervical fusion with limited neck movement.  Wide mouth opening.  Easy Glidescope intubation.

## 2015-11-03 NOTE — Discharge Instructions (Signed)

## 2015-11-03 NOTE — Op Note (Signed)
11/03/2015  11:49 AM  PATIENT:  Mitchell Harris    PRE-OPERATIVE DIAGNOSIS:  PRIMARY OSTEOARTHRITIS RIGHT SHOULDER  POST-OPERATIVE DIAGNOSIS:  Same  PROCEDURE:  RIGHT TOTAL SHOULDER ARTHROPLASTY  SURGEON:  Eulas PostLANDAU,Miyu Fenderson P, MD  PHYSICIAN ASSISTANT: Janace LittenBrandon Parry, OPA-C, present and scrubbed throughout the case, critical for completion in a timely fashion, and for retraction, instrumentation, and closure.  ANESTHESIA:   General  PREOPERATIVE INDICATIONS:  Mitchell Harris is a  74 y.o. male with a diagnosis of PRIMARY OSTEOARTHRITIS RIGHT SHOULDER who failed conservative measures and elected for surgical management.    The risks benefits and alternatives were discussed with the patient preoperatively including but not limited to the risks of infection, bleeding, nerve injury, cardiopulmonary complications, the need for revision surgery, dislocation, loosening, incomplete relief of pain, among others, and the patient was willing to proceed.   OPERATIVE IMPLANTS: Biomet size 13 mini press-fit humeral stem, size 50+21 Versa-dial humeral head, set in the E position with increased coverage posteriorly, with a medium cemented glenoid polyethylene 3 peg implant with a central regenerex noncemented post.   OPERATIVE FINDINGS: Advanced glenohumeral osteoarthritis involving the glenoid and the humeral head with substantial osteophyte formation inferiorly. There was significant posterior glenoid erosion.  Unique aspects of the case: The canal sized slightly larger than the contralateral side, and the head size itself was also larger. I took great care to try and make sure that I was not getting fooled by osteophyte, but the size 50 mm head provided much improved coverage compared to the 46. Access to the posterior glenoid was fairly challenging, I did have 3 complete sockets that were within the vault, along with the central regenerate exposed, I was a little concerned at first that the posterior socket  may not have been completely drilled to the depth, but the implant did seat well and had no rocking and had excellent fixation.   OPERATIVE PROCEDURE: The patient was brought to the operating room and placed in the supine position. General anesthesia was administered. IV antibiotics were given.  The upper extremity was prepped and draped in usual sterile fashion. The patient was in a beachchair position with all bony prominences padded.   Time out was performed and a deltopectoral approach was carried out. The biceps tendon was tenodesed to the pectoralis tendon. The subscapularis was released, tagging it with a #2 MaxBraid, leaving a cuff of tendon for repair.   The inferior osteophyte was removed, and release of the capsule off of the humeral side was completed. The head was dislocated, and I reamed sequentially. I placed the humeral cutting guide at 30 of retroversion, and then pinned this into place, and made my humeral neck cut. This was at the appropriate level.   I then placed deep retractors and exposed the glenoid. I excised the labrum circumferentially, taking care to protect the axillary nerve inferiorly.   I then placed a guidewire into the center position, controlling appropriate version and inclination. I then reamed over the guidewire with the small reamer, and was satisfied with the preparation. I preserved the subchondral bone in order to maximize the strength and minimize the risk for subsequent subsidence.   I then drilled the central hole for the regenerex peg, and then placed the guide, and then drilled the 3 peripheral peg holes. I had excellent bony circumferential contact.   I then cleaned the glenoid, irrigated it copiously, and then dried it and cemented the prosthesis into place. Excellent seating was achieved. I had  full exposure. The cement cured, and then I turned my attention to the humeral side.   I sequentially broached, up to the selected size, with the broach set  at 30 of retroversion. I then placed the real stem. I trialed with multiple heads, and the above-named component was selected. Increased posterior coverage improved the coverage. The soft tissue tension was appropriate.   I then impacted the real humeral head into place, reduced the head, and irrigated copiously. Excellent stability and range of motion was achieved. I repaired the subscapularis with 4 #2 MaxBraid, as well as the rotator interval, and irrigated copiously once more. The subcutaneous tissue was closed with Vicryl including the deltopectoral fascia.   The skin was closed with Steri-Strips and sterile gauze was applied. He had a preoperative nerve block. He tolerated the procedure well and there were no complications.

## 2015-11-03 NOTE — Progress Notes (Signed)
Assisted Dr. Singer with right, ultrasound guided, interscalene  block. Side rails up, monitors on throughout procedure. See vital signs in flow sheet. Tolerated Procedure well. 

## 2015-11-03 NOTE — Anesthesia Preprocedure Evaluation (Addendum)
Anesthesia Evaluation  Patient identified by MRN, date of birth, ID band Patient awake    Reviewed: Allergy & Precautions, NPO status , Patient's Chart, lab work & pertinent test results  History of Anesthesia Complications (+) PONV and history of anesthetic complications  Airway Mallampati: III  TM Distance: >3 FB Neck ROM: Full    Dental  (+) Teeth Intact, Dental Advisory Given   Pulmonary neg pulmonary ROS,    Pulmonary exam normal        Cardiovascular hypertension, Normal cardiovascular exam     Neuro/Psych Anxiety Depression negative neurological ROS     GI/Hepatic negative GI ROS, Neg liver ROS,   Endo/Other  negative endocrine ROS  Renal/GU negative Renal ROS     Musculoskeletal   Abdominal   Peds  Hematology   Anesthesia Other Findings   Reproductive/Obstetrics                            Anesthesia Physical Anesthesia Plan  ASA: III  Anesthesia Plan: General   Post-op Pain Management: GA combined w/ Regional for post-op pain   Induction: Intravenous  Airway Management Planned: Oral ETT  Additional Equipment:   Intra-op Plan:   Post-operative Plan: Extubation in OR  Informed Consent: I have reviewed the patients History and Physical, chart, labs and discussed the procedure including the risks, benefits and alternatives for the proposed anesthesia with the patient or authorized representative who has indicated his/her understanding and acceptance.   Dental advisory given  Plan Discussed with: CRNA, Anesthesiologist and Surgeon  Anesthesia Plan Comments:        Anesthesia Quick Evaluation

## 2015-11-03 NOTE — H&P (Signed)
PREOPERATIVE H&P  Chief Complaint: PRIMARY OSTEOARTHRITIS RIGHT SHOULDER  HPI: Mitchell Harris is a 74 y.o. male who presents for preoperative history and physical with a diagnosis of PRIMARY OSTEOARTHRITIS RIGHT SHOULDER. Symptoms are rated as moderate to severe, and have been worsening.  This is significantly impairing activities of daily living.  He has elected for surgical management.  Pain is rated 10/10, he is miserable, as been on long-term narcotic medications, and continues to have severe pain.  He has failed injections, activity modification, anti-inflammatories, and assistive devices.  Preoperative X-rays demonstrate end stage degenerative changes with osteophyte formation, loss of joint space, subchondral sclerosis.   Past Medical History  Diagnosis Date  . Complication of anesthesia   . PONV (postoperative nausea and vomiting)   . Family history of adverse reaction to anesthesia   . Hypertension   . Neuromuscular disorder (HCC)     cervical degeneration  . History of blood transfusion   . Arthritis     OA- hands, knees, neck  . Macular degeneration     left eye, rec'ing injecctions   . 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 Anxiety   . Depression    Past Surgical History  Procedure Laterality Date  . Joint replacement  2000&2007    both knees   . Shoulder arthroscopy      both shoulders   . Cervical fusion  2008  . Knee surgery      arthroscopic- both knees , L knee reconstruction prior to replacement   . Tonsillectomy    . Cholecystectomy    . Total shoulder arthroplasty Left 11/01/2014    Procedure: TOTAL SHOULDER ARTHROPLASTY;  Surgeon: Eulas PostJoshua P Jacarri Gesner, MD;  Location: [facility] OR;  Service: Orthopedics;  Laterality: Left;   Social History   Social History  . Marital Status: Single    Spouse Name: N/A  . Number of Children: N/A  . Years of Education: N/A   Social History Main Topics  . Smoking status: Never Smoker   .  Smokeless tobacco: None  . Alcohol Use: No  . Drug Use: No  . Sexual Activity: Not Asked   Other Topics Concern  . None   Social History Narrative   History reviewed. No pertinent family history. No Known Allergies Prior to Admission medications   Medication Sig Start Date End Date Taking? Authorizing Provider  amLODipine (NORVASC) 5 MG tablet Take 5 mg by mouth daily after supper.    Yes Historical Provider, MD  atorvastatin (LIPITOR) 10 MG tablet Take 10 mg by mouth daily at 6 PM.    Yes Historical Provider, MD  losartan (COZAAR) 100 MG tablet Take 100 mg by mouth daily after supper.    Yes Historical Provider, MD  oxyCODONE-acetaminophen (PERCOCET) 10-325 MG per tablet Take 1-2 tablets by mouth every 6 (six) hours as needed for pain. MAXIMUM TOTAL ACETAMINOPHEN DOSE IS 4000 MG PER DAY 11/01/14  Yes Teryl LucyJoshua Chekesha Behlke, MD  sennosides-docusate sodium (SENOKOT-S) 8.6-50 MG tablet Take 2 tablets by mouth daily. 11/01/14  Yes Teryl LucyJoshua Chariti Havel, MD  tamsulosin (FLOMAX) 0.4 MG CAPS capsule Take 0.4 mg by mouth daily after supper.    Yes Historical Provider, MD  zolpidem (AMBIEN) 10 MG tablet Take 10 mg by mouth at bedtime.   Yes Historical Provider, MD  ondansetron (ZOFRAN) 4 MG tablet Take 1 tablet (4 mg total) by mouth every 8 (eight) hours as needed for nausea or vomiting. Patient not taking: Reported on 02/14/2015 11/01/14  Teryl Lucy, MD     Positive ROS: All other systems have been reviewed and were otherwise negative with the exception of those mentioned in the HPI and as above.  Physical Exam: General: Alert, no acute distress Cardiovascular: No pedal edema Respiratory: No cyanosis, no use of accessory musculature GI: No organomegaly, abdomen is soft and non-tender Skin: No lesions in the area of chief complaint Neurologic: Sensation intact distally Psychiatric: Patient is competent for consent with normal mood and affect Lymphatic: No axillary or cervical  lymphadenopathy  MUSCULOSKELETAL: Right shoulder active motion is 0-75, external rotation to neutral, cuff strength is intact, positive crepitance.  Assessment: PRIMARY OSTEOARTHRITIS RIGHT SHOULDER   Plan: Plan for Procedure(s): RIGHT TOTAL SHOULDER ARTHROPLASTY  The risks benefits and alternatives were discussed with the patient including but not limited to the risks of nonoperative treatment, versus surgical intervention including infection, bleeding, nerve injury,  blood clots, cardiopulmonary complications, morbidity, mortality, among others, and they were willing to proceed.   Eulas Post, MD Cell [phone]   11/03/2015 9:09 AM

## 2015-11-04 DIAGNOSIS — M19011 Primary osteoarthritis, right shoulder: Secondary | ICD-10-CM | POA: Diagnosis not present

## 2015-11-06 ENCOUNTER — Encounter (HOSPITAL_BASED_OUTPATIENT_CLINIC_OR_DEPARTMENT_OTHER): Payer: Self-pay | Admitting: Orthopedic Surgery

## 2015-11-16 ENCOUNTER — Encounter (INDEPENDENT_AMBULATORY_CARE_PROVIDER_SITE_OTHER): Payer: Medicare PPO | Admitting: Ophthalmology

## 2015-11-16 DIAGNOSIS — H35033 Hypertensive retinopathy, bilateral: Secondary | ICD-10-CM

## 2015-11-16 DIAGNOSIS — H43813 Vitreous degeneration, bilateral: Secondary | ICD-10-CM

## 2015-11-16 DIAGNOSIS — I10 Essential (primary) hypertension: Secondary | ICD-10-CM

## 2015-11-16 DIAGNOSIS — H353114 Nonexudative age-related macular degeneration, right eye, advanced atrophic with subfoveal involvement: Secondary | ICD-10-CM | POA: Diagnosis not present

## 2015-11-16 DIAGNOSIS — H353221 Exudative age-related macular degeneration, left eye, with active choroidal neovascularization: Secondary | ICD-10-CM | POA: Diagnosis not present

## 2016-01-04 ENCOUNTER — Encounter (INDEPENDENT_AMBULATORY_CARE_PROVIDER_SITE_OTHER): Payer: Medicare PPO | Admitting: Ophthalmology

## 2016-01-04 DIAGNOSIS — I10 Essential (primary) hypertension: Secondary | ICD-10-CM

## 2016-01-04 DIAGNOSIS — H353114 Nonexudative age-related macular degeneration, right eye, advanced atrophic with subfoveal involvement: Secondary | ICD-10-CM

## 2016-01-04 DIAGNOSIS — H2513 Age-related nuclear cataract, bilateral: Secondary | ICD-10-CM

## 2016-01-04 DIAGNOSIS — H43813 Vitreous degeneration, bilateral: Secondary | ICD-10-CM | POA: Diagnosis not present

## 2016-01-04 DIAGNOSIS — H353221 Exudative age-related macular degeneration, left eye, with active choroidal neovascularization: Secondary | ICD-10-CM

## 2016-01-04 DIAGNOSIS — H35033 Hypertensive retinopathy, bilateral: Secondary | ICD-10-CM

## 2016-02-22 ENCOUNTER — Encounter (INDEPENDENT_AMBULATORY_CARE_PROVIDER_SITE_OTHER): Payer: Medicare PPO | Admitting: Ophthalmology

## 2016-02-22 DIAGNOSIS — H353114 Nonexudative age-related macular degeneration, right eye, advanced atrophic with subfoveal involvement: Secondary | ICD-10-CM | POA: Diagnosis not present

## 2016-02-22 DIAGNOSIS — H35033 Hypertensive retinopathy, bilateral: Secondary | ICD-10-CM | POA: Diagnosis not present

## 2016-02-22 DIAGNOSIS — I10 Essential (primary) hypertension: Secondary | ICD-10-CM | POA: Diagnosis not present

## 2016-02-22 DIAGNOSIS — H2513 Age-related nuclear cataract, bilateral: Secondary | ICD-10-CM

## 2016-02-22 DIAGNOSIS — H43813 Vitreous degeneration, bilateral: Secondary | ICD-10-CM | POA: Diagnosis not present

## 2016-02-22 DIAGNOSIS — H353221 Exudative age-related macular degeneration, left eye, with active choroidal neovascularization: Secondary | ICD-10-CM | POA: Diagnosis not present

## 2016-04-10 ENCOUNTER — Encounter (INDEPENDENT_AMBULATORY_CARE_PROVIDER_SITE_OTHER): Payer: Medicare PPO | Admitting: Ophthalmology

## 2016-04-10 DIAGNOSIS — H35033 Hypertensive retinopathy, bilateral: Secondary | ICD-10-CM

## 2016-04-10 DIAGNOSIS — I10 Essential (primary) hypertension: Secondary | ICD-10-CM

## 2016-04-10 DIAGNOSIS — H353221 Exudative age-related macular degeneration, left eye, with active choroidal neovascularization: Secondary | ICD-10-CM | POA: Diagnosis not present

## 2016-04-10 DIAGNOSIS — H2513 Age-related nuclear cataract, bilateral: Secondary | ICD-10-CM | POA: Diagnosis not present

## 2016-04-10 DIAGNOSIS — H353114 Nonexudative age-related macular degeneration, right eye, advanced atrophic with subfoveal involvement: Secondary | ICD-10-CM

## 2016-04-10 DIAGNOSIS — H43813 Vitreous degeneration, bilateral: Secondary | ICD-10-CM

## 2016-04-18 ENCOUNTER — Ambulatory Visit (INDEPENDENT_AMBULATORY_CARE_PROVIDER_SITE_OTHER): Payer: Medicare PPO | Admitting: Sports Medicine

## 2016-04-18 ENCOUNTER — Encounter: Payer: Self-pay | Admitting: Sports Medicine

## 2016-04-18 DIAGNOSIS — M79675 Pain in left toe(s): Secondary | ICD-10-CM

## 2016-04-18 DIAGNOSIS — L600 Ingrowing nail: Secondary | ICD-10-CM | POA: Diagnosis not present

## 2016-04-18 DIAGNOSIS — L603 Nail dystrophy: Secondary | ICD-10-CM

## 2016-04-18 NOTE — Progress Notes (Signed)
Patient ID: Mitchell Harris, male   DOB: 1941-10-28, 75 y.o.   MRN: [id number] Subjective: Mitchell Harris is a 75 y.o. male patient presents to office today complaining of a painful incurvated,  lateral nail border of the 1st toe on the left foot. This has been present for years. Patient has treated this by having a past nail avulsion by Dr. Leeanne Deeduchman, but the portion of the nail grew back and has been routinely trimming it. 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 Denies any known trauma. Denies any drainage or signs of infection coming from the involved toe. Patient denies fever/chills/nausea/vomitting/any other related constitutional symptoms at this time.  Patient Active Problem List   Diagnosis Date Noted  . Primary localized osteoarthrosis of right shoulder 11/03/2015  . S/P shoulder replacement 11/03/2015  . 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 amLODipine (NORVASC) 5 MG tablet Take 5 mg by mouth daily after supper.     Marland Kitchen. atorvastatin (LIPITOR) 10 MG tablet Take 10 mg by mouth daily at 6 PM.     . HYDROmorphone (DILAUDID) 2 MG tablet Take 1 tablet (2 mg total) by mouth every 4 (four) hours as needed for severe pain. 50 tablet 0  . losartan (COZAAR) 100 MG tablet Take 100 mg by mouth daily after supper.     . ondansetron (ZOFRAN) 4 MG tablet Take 1 tablet (4 mg total) by mouth every 8 (eight) hours as needed for nausea or vomiting. 30 tablet 0  . oxyCODONE-acetaminophen (PERCOCET) 10-325 MG tablet Take 1-2 tablets by mouth every 6 (six) hours as needed for pain. MAXIMUM TOTAL ACETAMINOPHEN DOSE IS 4000 MG PER DAY 75 tablet 0  . sennosides-docusate sodium (SENOKOT-S) 8.6-50 MG tablet Take 2 tablets by mouth daily. 30 tablet 1  . tamsulosin (FLOMAX) 0.4 MG CAPS  capsule Take 0.4 mg by mouth daily after supper.     . zolpidem (AMBIEN) 10 MG tablet Take 10 mg by mouth at bedtime.     No current facility-administered medications on file prior to visit.    No Known Allergies  Objective:  There were no vitals filed for this visit.  General: Well developed, nourished, in no acute distress, alert and oriented x3   Dermatology: Skin is warm, dry and supple bilateral. Left hallux nail appears to be Moderately incurvated with hyperkeratosis formation at the distal aspects of the  lateral nail border. This nail is severely thickened with subungal derbis suggestive of mycosis as well. (-) Erythema. (-) Edema. (-) serosanguous drainage present. The remaining nails appear unremarkable at this time free from ingrowing. There are no open sores, lesions or other signs of infection present.  Vascular: Dorsalis Pedis artery and Posterior Tibial artery pedal pulses are 2/4 bilateral with immedate capillary fill time. Pedal hair growth present. No lower extremity edema.   Neruologic: Grossly intact via light touch bilateral.  Musculoskeletal: Tenderness to palpation of the left hallux lateral greater than medial nail fold. Muscular strength within normal limits in all groups bilateral.   Assesement and Plan: Problem List Items Addressed This Visit    None    Visit Diagnoses    Ingrown nail    -  Primary    Left hallux lateral margin    Nail dystrophy  Diffuse left hallux nail    Toe pain, left           -Discussed treatment alternatives and plan of care; Explained permanent/temporary nail avulsion and post procedure course to patient. Patient opt for total permanent left hallux nail avulsion. - After a verbal consent, injected 3 ml of a 50:50 mixture of 2% plain  lidocaine and 0.5% plain marcaine in a normal hallux block fashion. Next, a  betadine prep was performed. Anesthesia was tested and found to be appropriate.  The left hallux nail was then  incised from the hyponychium to the epinychium. The left hallux nail was removed and cleared from the field. The area was curretted for any remaining nail or spicules. Phenol application performed and the area was then flushed with alcohol and dressed with antibiotic cream and a dry sterile dressing. -Patient was instructed to leave the dressing intact for today and begin soaking  in a weak solution of betadine and water tomorrow. Patient was instructed to  soak for 15 minutes each day and apply neosporin and a gauze or bandaid dressing each day. -Patient was instructed to monitor the toe for signs of infection and return to office if toe becomes red, hot or swollen. 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 May need to wrap her bandage to toe and a heavier gauze dressing during work. -Advised ice, elevation, and tylenol or motrin if needed for pain.  -Patient is to return in 1 week for follow up care/nail check or sooner if problems arise.  Asencion Islam, DPM

## 2016-04-18 NOTE — Patient Instructions (Addendum)

## 2016-04-24 ENCOUNTER — Ambulatory Visit: Payer: Medicare PPO | Admitting: Podiatry

## 2016-04-25 ENCOUNTER — Encounter: Payer: Self-pay | Admitting: Sports Medicine

## 2016-04-25 ENCOUNTER — Ambulatory Visit (INDEPENDENT_AMBULATORY_CARE_PROVIDER_SITE_OTHER): Payer: Medicare PPO | Admitting: Sports Medicine

## 2016-04-25 DIAGNOSIS — M79675 Pain in left toe(s): Secondary | ICD-10-CM

## 2016-04-25 DIAGNOSIS — Z9889 Other specified postprocedural states: Secondary | ICD-10-CM

## 2016-04-25 NOTE — Progress Notes (Signed)
Patient ID: Mitchell Harris, male   DOB: [birth year], 75 y.o.   MRN: [id number] Subjective: Mitchell Harris is a 75 y.o. male patient returns to office today for follow up evaluation after having Left Hallux  Permanent total nail avulsion performed on 04-18-16. Patient has been soaking using betadine and epsom salt and applying topical antibiotic covered with bandaid daily. Patient denies fever/chills/nausea/vomitting/any other related constitutional symptoms at this time.  Patient Active Problem List   Diagnosis Date Noted  . Primary localized osteoarthrosis of right shoulder 11/03/2015  . S/P shoulder replacement 11/03/2015  . 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 amLODipine (NORVASC) 5 MG tablet Take 5 mg by mouth daily after supper.     Marland Kitchen atorvastatin (LIPITOR) 10 MG tablet Take 10 mg by mouth daily at 6 PM.     . HYDROmorphone (DILAUDID) 2 MG tablet Take 1 tablet (2 mg total) by mouth every 4 (four) hours as needed for severe pain. 50 tablet 0  . losartan (COZAAR) 100 MG tablet Take 100 mg by mouth daily after supper.     . ondansetron (ZOFRAN) 4 MG tablet Take 1 tablet (4 mg total) by mouth every 8 (eight) hours as needed for nausea or vomiting. 30 tablet 0  . oxyCODONE-acetaminophen (PERCOCET) 10-325 MG tablet Take 1-2 tablets by mouth every 6 (six) hours as needed for pain. MAXIMUM TOTAL ACETAMINOPHEN DOSE IS 4000 MG PER DAY 75 tablet 0  . sennosides-docusate sodium (SENOKOT-S) 8.6-50 MG tablet Take 2 tablets by mouth daily. 30 tablet 1  . tamsulosin (FLOMAX) 0.4 MG CAPS capsule Take 0.4 mg by mouth daily after supper.     . zolpidem (AMBIEN) 10 MG tablet Take 10 mg by mouth at bedtime.     No current facility-administered medications on file prior to visit.    No Known Allergies  Objective:  General: Well developed, nourished, in no acute distress, alert and  oriented x3   Dermatology: Skin is warm, dry and supple bilateral. Left hallux nail bed appears to be clean, dry, with mild granular tissue and surrounding eschar/scab. (-) Erythema. (-) Edema. (-) serosanguous drainage present. The remaining nails appear unremarkable at this time. There are no other lesions or other signs of infection present.  Neurovascular status: Intact. No lower extremity swelling; No pain with calf compression bilateral.  Musculoskeletal: Decreased tenderness to palpation of the Left hallux. Muscular strength within normal limits bilateral.   Assesement and Plan: Problem List Items Addressed This Visit    None    Visit Diagnoses    S/P nail surgery    -  Primary    Left hallux total permanent nail avulsion 04-18-16    Toe pain, left           -Examined patient  -Cleansed left hallux nail bed and gently scrubbed with peroxide and applied antibiotic cream covered with bandaid.  -Discussed plan of care with patient. -Patient to weak from soaking in a weak solution of Epsom salt and warm water until the toe appears normal  and apply neosporin and a gauze or bandaid dressing each day as needed. May leave open to air at night. -Educated patient on long term care after nail surgery. -Patient was instructed to monitor the toe for reoccurrence and signs of infection; Patient advised to return to office or go to ER if toe becomes red, hot or swollen. -Patient  is to return as needed or sooner if problems arise.  Mitchell Harris, DPM

## 2016-05-29 ENCOUNTER — Encounter (INDEPENDENT_AMBULATORY_CARE_PROVIDER_SITE_OTHER): Payer: Medicare PPO | Admitting: Ophthalmology

## 2016-05-29 DIAGNOSIS — H353221 Exudative age-related macular degeneration, left eye, with active choroidal neovascularization: Secondary | ICD-10-CM

## 2016-05-29 DIAGNOSIS — H353112 Nonexudative age-related macular degeneration, right eye, intermediate dry stage: Secondary | ICD-10-CM

## 2016-05-29 DIAGNOSIS — I10 Essential (primary) hypertension: Secondary | ICD-10-CM

## 2016-05-29 DIAGNOSIS — H43813 Vitreous degeneration, bilateral: Secondary | ICD-10-CM

## 2016-05-29 DIAGNOSIS — H2513 Age-related nuclear cataract, bilateral: Secondary | ICD-10-CM | POA: Diagnosis not present

## 2016-05-29 DIAGNOSIS — H35033 Hypertensive retinopathy, bilateral: Secondary | ICD-10-CM

## 2016-07-24 ENCOUNTER — Encounter (INDEPENDENT_AMBULATORY_CARE_PROVIDER_SITE_OTHER): Payer: Medicare PPO | Admitting: Ophthalmology

## 2016-07-24 DIAGNOSIS — H35033 Hypertensive retinopathy, bilateral: Secondary | ICD-10-CM

## 2016-07-24 DIAGNOSIS — H2513 Age-related nuclear cataract, bilateral: Secondary | ICD-10-CM | POA: Diagnosis not present

## 2016-07-24 DIAGNOSIS — H43813 Vitreous degeneration, bilateral: Secondary | ICD-10-CM | POA: Diagnosis not present

## 2016-07-24 DIAGNOSIS — H353221 Exudative age-related macular degeneration, left eye, with active choroidal neovascularization: Secondary | ICD-10-CM | POA: Diagnosis not present

## 2016-07-24 DIAGNOSIS — I10 Essential (primary) hypertension: Secondary | ICD-10-CM | POA: Diagnosis not present

## 2016-07-24 DIAGNOSIS — H353112 Nonexudative age-related macular degeneration, right eye, intermediate dry stage: Secondary | ICD-10-CM

## 2016-09-18 ENCOUNTER — Encounter (INDEPENDENT_AMBULATORY_CARE_PROVIDER_SITE_OTHER): Payer: Medicare PPO | Admitting: Ophthalmology

## 2016-09-18 DIAGNOSIS — H353221 Exudative age-related macular degeneration, left eye, with active choroidal neovascularization: Secondary | ICD-10-CM | POA: Diagnosis not present

## 2016-09-18 DIAGNOSIS — H353112 Nonexudative age-related macular degeneration, right eye, intermediate dry stage: Secondary | ICD-10-CM

## 2016-09-18 DIAGNOSIS — I10 Essential (primary) hypertension: Secondary | ICD-10-CM

## 2016-09-18 DIAGNOSIS — H43813 Vitreous degeneration, bilateral: Secondary | ICD-10-CM | POA: Diagnosis not present

## 2016-09-18 DIAGNOSIS — H2513 Age-related nuclear cataract, bilateral: Secondary | ICD-10-CM

## 2016-09-18 DIAGNOSIS — H35033 Hypertensive retinopathy, bilateral: Secondary | ICD-10-CM

## 2016-11-13 ENCOUNTER — Encounter (INDEPENDENT_AMBULATORY_CARE_PROVIDER_SITE_OTHER): Payer: Medicare PPO | Admitting: Ophthalmology

## 2016-11-13 DIAGNOSIS — I10 Essential (primary) hypertension: Secondary | ICD-10-CM

## 2016-11-13 DIAGNOSIS — H353221 Exudative age-related macular degeneration, left eye, with active choroidal neovascularization: Secondary | ICD-10-CM | POA: Diagnosis not present

## 2016-11-13 DIAGNOSIS — H35033 Hypertensive retinopathy, bilateral: Secondary | ICD-10-CM | POA: Diagnosis not present

## 2016-11-13 DIAGNOSIS — H43813 Vitreous degeneration, bilateral: Secondary | ICD-10-CM

## 2016-11-13 DIAGNOSIS — H353114 Nonexudative age-related macular degeneration, right eye, advanced atrophic with subfoveal involvement: Secondary | ICD-10-CM

## 2016-11-13 DIAGNOSIS — H2513 Age-related nuclear cataract, bilateral: Secondary | ICD-10-CM

## 2017-01-08 ENCOUNTER — Encounter (INDEPENDENT_AMBULATORY_CARE_PROVIDER_SITE_OTHER): Payer: Medicare PPO | Admitting: Ophthalmology

## 2017-01-08 DIAGNOSIS — H43813 Vitreous degeneration, bilateral: Secondary | ICD-10-CM

## 2017-01-08 DIAGNOSIS — H2513 Age-related nuclear cataract, bilateral: Secondary | ICD-10-CM

## 2017-01-08 DIAGNOSIS — H35033 Hypertensive retinopathy, bilateral: Secondary | ICD-10-CM

## 2017-01-08 DIAGNOSIS — I10 Essential (primary) hypertension: Secondary | ICD-10-CM | POA: Diagnosis not present

## 2017-01-08 DIAGNOSIS — H353221 Exudative age-related macular degeneration, left eye, with active choroidal neovascularization: Secondary | ICD-10-CM | POA: Diagnosis not present

## 2017-01-08 DIAGNOSIS — H353112 Nonexudative age-related macular degeneration, right eye, intermediate dry stage: Secondary | ICD-10-CM | POA: Diagnosis not present

## 2017-02-25 ENCOUNTER — Encounter (INDEPENDENT_AMBULATORY_CARE_PROVIDER_SITE_OTHER): Payer: Medicare PPO | Admitting: Ophthalmology

## 2017-02-25 DIAGNOSIS — I10 Essential (primary) hypertension: Secondary | ICD-10-CM | POA: Diagnosis not present

## 2017-02-25 DIAGNOSIS — H2513 Age-related nuclear cataract, bilateral: Secondary | ICD-10-CM | POA: Diagnosis not present

## 2017-02-25 DIAGNOSIS — H353231 Exudative age-related macular degeneration, bilateral, with active choroidal neovascularization: Secondary | ICD-10-CM

## 2017-02-25 DIAGNOSIS — H43813 Vitreous degeneration, bilateral: Secondary | ICD-10-CM

## 2017-02-25 DIAGNOSIS — H35033 Hypertensive retinopathy, bilateral: Secondary | ICD-10-CM | POA: Diagnosis not present

## 2017-02-25 DIAGNOSIS — H353112 Nonexudative age-related macular degeneration, right eye, intermediate dry stage: Secondary | ICD-10-CM | POA: Diagnosis not present

## 2017-03-28 ENCOUNTER — Encounter: Payer: Self-pay | Admitting: Sports Medicine

## 2017-03-28 ENCOUNTER — Ambulatory Visit (INDEPENDENT_AMBULATORY_CARE_PROVIDER_SITE_OTHER): Payer: Medicare PPO | Admitting: Sports Medicine

## 2017-03-28 ENCOUNTER — Ambulatory Visit (INDEPENDENT_AMBULATORY_CARE_PROVIDER_SITE_OTHER): Payer: Medicare PPO

## 2017-03-28 DIAGNOSIS — M778 Other enthesopathies, not elsewhere classified: Secondary | ICD-10-CM

## 2017-03-28 DIAGNOSIS — M7751 Other enthesopathy of right foot: Secondary | ICD-10-CM | POA: Diagnosis not present

## 2017-03-28 DIAGNOSIS — M7671 Peroneal tendinitis, right leg: Secondary | ICD-10-CM

## 2017-03-28 DIAGNOSIS — M779 Enthesopathy, unspecified: Principal | ICD-10-CM

## 2017-03-28 DIAGNOSIS — R52 Pain, unspecified: Secondary | ICD-10-CM

## 2017-03-28 DIAGNOSIS — M7661 Achilles tendinitis, right leg: Secondary | ICD-10-CM | POA: Diagnosis not present

## 2017-03-28 MED ORDER — TRIAMCINOLONE ACETONIDE 10 MG/ML IJ SUSP: 10.0000 mg | Freq: Once | INTRAMUSCULAR | Status: AC

## 2017-03-28 MED ORDER — MELOXICAM 15 MG PO TABS: 15.0000 mg | ORAL_TABLET | Freq: Every day | ORAL | 0 refills | Status: DC

## 2017-03-28 NOTE — Progress Notes (Signed)
Subjective: Mitchell Harris is a 76 y.o. male patient who presents to office for evaluation of Right foot pain. 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  Patient has tried nothing. Patient denies any other pedal complaints.  Patient Active Problem List   Diagnosis Date Noted  . Primary localized osteoarthrosis of right shoulder 11/03/2015  . S/P shoulder replacement 11/03/2015  . 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 amLODipine (NORVASC) 5 MG tablet Take 5 mg by mouth daily after supper.     Marland Kitchen atorvastatin (LIPITOR) 10 MG tablet Take 10 mg by mouth daily at 6 PM.     . HYDROmorphone (DILAUDID) 2 MG tablet Take 1 tablet (2 mg total) by mouth every 4 (four) hours as needed for severe pain. 50 tablet 0  . losartan (COZAAR) 100 MG tablet Take 100 mg by mouth daily after supper.     . ondansetron (ZOFRAN) 4 MG tablet Take 1 tablet (4 mg total) by mouth every 8 (eight) hours as needed for nausea or vomiting. 30 tablet 0  . oxyCODONE-acetaminophen (PERCOCET) 10-325 MG tablet Take 1-2 tablets by mouth every 6 (six) hours as needed for pain. MAXIMUM TOTAL ACETAMINOPHEN DOSE IS 4000 MG PER DAY 75 tablet 0  . sennosides-docusate sodium (SENOKOT-S) 8.6-50 MG tablet Take 2 tablets by mouth daily. 30 tablet 1  . tamsulosin (FLOMAX) 0.4 MG CAPS capsule Take 0.4 mg by mouth daily after supper.     . zolpidem (AMBIEN) 10 MG tablet Take 10 mg by mouth at bedtime.     No current facility-administered medications on file prior to visit.     No Known Allergies  Objective:  General: Alert and oriented x3 in no acute distress  Dermatology: No open lesions bilateral lower extremities, no webspace macerations, no ecchymosis bilateral, all nails x 10 are well manicured.  Vascular: Dorsalis Pedis and Posterior Tibial pedal pulses  palpable, Capillary Fill Time 3 seconds,(+) pedal hair growth bilateral, no edema bilateral lower extremities, Temperature gradient within normal limits.  Neurology: Johney Maine sensation intact via light touch bilateral, (- )Tinels sign bilateral.   Musculoskeletal: Mild tenderness with palpation at 5th met base on right foot,No pain with calf compression bilateral. There is decreased ankle rom with knee extending  vs flexed resembling gastroc equnius bilateral, Subtalar joint range of motion is within normal limits, there is no 1st ray hypermobility noted bilateral, decreased 1st MPJ rom Right>Left with functional limitus noted on weightbearing exam. Strength within normal limits in all groups bilateral.   Gait: Antalgic gait  Xrays  Right Foot   Impression: Mild soft tissue swelling of the base of the right fifth metatarsal with bone spur at insertion of peroneal brevis tendon. There is mild diffuse arthritis. No other acute findings.  Assessment and Plan: Problem List Items Addressed This Visit    None    Visit Diagnoses    Capsulitis of foot, right    -  Primary   Relevant Medications   meloxicam (MOBIC) 15 MG tablet   triamcinolone acetonide (KENALOG) 10 MG/ML injection 10 mg   Pain       Relevant Medications   triamcinolone acetonide (KENALOG) 10 MG/ML injection 10 mg   Other Relevant Orders   DG Foot Complete Right   Peroneal tendonitis of right lower leg       Relevant  Medications   meloxicam (MOBIC) 15 MG tablet   triamcinolone acetonide (KENALOG) 10 MG/ML injection 10 mg       -Complete examination performed -Xrays reviewed -Discussed treatement optionsFor capsulitis at peroneal brevis insertion -After oral consent and aseptic prep, injected a mixture containing 1 ml of 2%  plain lidocaine, 1 ml 0.5% plain marcaine, 0.5 ml of kenalog 10 and 0.5 ml of dexamethasone phosphate into right 5th met base without complication. Post-injection care discussed with patient.   -Prescribed meloxicam to take as instructed -Recommend good supportive shoes and inserts -Advised patient to avoid activities that aggravate the inflamed area -Patient to return to office as needed or sooner if condition worsens.  Landis Martins, DPM

## 2017-03-28 NOTE — Progress Notes (Signed)
   Subjective:    Patient ID: Mitchell Harris, male    DOB: 04-29-1941, 76 y.o.   MRN: [id number]  HPI   I have been hurting on the outside of my right foot for about 6 months and I am on my feet a lot and is sore and tender and hurts with shoes and going barefoot and feels like something is sticking out     Review of Systems  All other systems reviewed and are negative.      Objective:   Physical Exam        Assessment & Plan:

## 2017-04-22 ENCOUNTER — Encounter (INDEPENDENT_AMBULATORY_CARE_PROVIDER_SITE_OTHER): Payer: Medicare PPO | Admitting: Ophthalmology

## 2017-04-22 DIAGNOSIS — I10 Essential (primary) hypertension: Secondary | ICD-10-CM

## 2017-04-22 DIAGNOSIS — H353221 Exudative age-related macular degeneration, left eye, with active choroidal neovascularization: Secondary | ICD-10-CM

## 2017-04-22 DIAGNOSIS — H2513 Age-related nuclear cataract, bilateral: Secondary | ICD-10-CM

## 2017-04-22 DIAGNOSIS — H353112 Nonexudative age-related macular degeneration, right eye, intermediate dry stage: Secondary | ICD-10-CM

## 2017-04-22 DIAGNOSIS — H35033 Hypertensive retinopathy, bilateral: Secondary | ICD-10-CM | POA: Diagnosis not present

## 2017-04-22 DIAGNOSIS — H43813 Vitreous degeneration, bilateral: Secondary | ICD-10-CM

## 2017-06-17 ENCOUNTER — Encounter (INDEPENDENT_AMBULATORY_CARE_PROVIDER_SITE_OTHER): Payer: Medicare PPO | Admitting: Ophthalmology

## 2017-06-17 DIAGNOSIS — H35033 Hypertensive retinopathy, bilateral: Secondary | ICD-10-CM | POA: Diagnosis not present

## 2017-06-17 DIAGNOSIS — H353112 Nonexudative age-related macular degeneration, right eye, intermediate dry stage: Secondary | ICD-10-CM

## 2017-06-17 DIAGNOSIS — H353221 Exudative age-related macular degeneration, left eye, with active choroidal neovascularization: Secondary | ICD-10-CM

## 2017-06-17 DIAGNOSIS — H2513 Age-related nuclear cataract, bilateral: Secondary | ICD-10-CM | POA: Diagnosis not present

## 2017-06-17 DIAGNOSIS — H43813 Vitreous degeneration, bilateral: Secondary | ICD-10-CM

## 2017-06-17 DIAGNOSIS — I10 Essential (primary) hypertension: Secondary | ICD-10-CM | POA: Diagnosis not present

## 2017-08-12 ENCOUNTER — Encounter (INDEPENDENT_AMBULATORY_CARE_PROVIDER_SITE_OTHER): Payer: Medicare PPO | Admitting: Ophthalmology

## 2017-08-12 DIAGNOSIS — H43813 Vitreous degeneration, bilateral: Secondary | ICD-10-CM

## 2017-08-12 DIAGNOSIS — H353221 Exudative age-related macular degeneration, left eye, with active choroidal neovascularization: Secondary | ICD-10-CM

## 2017-08-12 DIAGNOSIS — H353112 Nonexudative age-related macular degeneration, right eye, intermediate dry stage: Secondary | ICD-10-CM

## 2017-08-12 DIAGNOSIS — I10 Essential (primary) hypertension: Secondary | ICD-10-CM | POA: Diagnosis not present

## 2017-08-12 DIAGNOSIS — H35033 Hypertensive retinopathy, bilateral: Secondary | ICD-10-CM

## 2017-10-07 ENCOUNTER — Encounter (INDEPENDENT_AMBULATORY_CARE_PROVIDER_SITE_OTHER): Payer: Medicare PPO | Admitting: Ophthalmology

## 2017-10-07 DIAGNOSIS — I10 Essential (primary) hypertension: Secondary | ICD-10-CM

## 2017-10-07 DIAGNOSIS — H43813 Vitreous degeneration, bilateral: Secondary | ICD-10-CM | POA: Diagnosis not present

## 2017-10-07 DIAGNOSIS — H35033 Hypertensive retinopathy, bilateral: Secondary | ICD-10-CM

## 2017-10-07 DIAGNOSIS — H353112 Nonexudative age-related macular degeneration, right eye, intermediate dry stage: Secondary | ICD-10-CM

## 2017-10-07 DIAGNOSIS — H2513 Age-related nuclear cataract, bilateral: Secondary | ICD-10-CM

## 2017-10-07 DIAGNOSIS — H353221 Exudative age-related macular degeneration, left eye, with active choroidal neovascularization: Secondary | ICD-10-CM

## 2017-12-03 ENCOUNTER — Encounter (INDEPENDENT_AMBULATORY_CARE_PROVIDER_SITE_OTHER): Payer: Medicare PPO | Admitting: Ophthalmology

## 2017-12-03 DIAGNOSIS — H353221 Exudative age-related macular degeneration, left eye, with active choroidal neovascularization: Secondary | ICD-10-CM | POA: Diagnosis not present

## 2017-12-03 DIAGNOSIS — H43813 Vitreous degeneration, bilateral: Secondary | ICD-10-CM | POA: Diagnosis not present

## 2017-12-03 DIAGNOSIS — I10 Essential (primary) hypertension: Secondary | ICD-10-CM | POA: Diagnosis not present

## 2017-12-03 DIAGNOSIS — H35033 Hypertensive retinopathy, bilateral: Secondary | ICD-10-CM

## 2017-12-03 DIAGNOSIS — H353112 Nonexudative age-related macular degeneration, right eye, intermediate dry stage: Secondary | ICD-10-CM | POA: Diagnosis not present

## 2018-01-28 ENCOUNTER — Encounter (INDEPENDENT_AMBULATORY_CARE_PROVIDER_SITE_OTHER): Payer: Medicare PPO | Admitting: Ophthalmology

## 2018-01-28 DIAGNOSIS — H43813 Vitreous degeneration, bilateral: Secondary | ICD-10-CM | POA: Diagnosis not present

## 2018-01-28 DIAGNOSIS — H2513 Age-related nuclear cataract, bilateral: Secondary | ICD-10-CM

## 2018-01-28 DIAGNOSIS — I10 Essential (primary) hypertension: Secondary | ICD-10-CM | POA: Diagnosis not present

## 2018-01-28 DIAGNOSIS — H35033 Hypertensive retinopathy, bilateral: Secondary | ICD-10-CM | POA: Diagnosis not present

## 2018-01-28 DIAGNOSIS — H353221 Exudative age-related macular degeneration, left eye, with active choroidal neovascularization: Secondary | ICD-10-CM

## 2018-01-28 DIAGNOSIS — H353112 Nonexudative age-related macular degeneration, right eye, intermediate dry stage: Secondary | ICD-10-CM

## 2018-03-18 ENCOUNTER — Encounter (INDEPENDENT_AMBULATORY_CARE_PROVIDER_SITE_OTHER): Payer: Medicare PPO | Admitting: Ophthalmology

## 2018-03-18 DIAGNOSIS — H353221 Exudative age-related macular degeneration, left eye, with active choroidal neovascularization: Secondary | ICD-10-CM

## 2018-03-18 DIAGNOSIS — H43813 Vitreous degeneration, bilateral: Secondary | ICD-10-CM | POA: Diagnosis not present

## 2018-03-18 DIAGNOSIS — H2513 Age-related nuclear cataract, bilateral: Secondary | ICD-10-CM | POA: Diagnosis not present

## 2018-03-18 DIAGNOSIS — H35033 Hypertensive retinopathy, bilateral: Secondary | ICD-10-CM | POA: Diagnosis not present

## 2018-03-18 DIAGNOSIS — I10 Essential (primary) hypertension: Secondary | ICD-10-CM | POA: Diagnosis not present

## 2018-03-18 DIAGNOSIS — H353112 Nonexudative age-related macular degeneration, right eye, intermediate dry stage: Secondary | ICD-10-CM | POA: Diagnosis not present

## 2018-05-13 ENCOUNTER — Encounter (INDEPENDENT_AMBULATORY_CARE_PROVIDER_SITE_OTHER): Payer: Medicare PPO | Admitting: Ophthalmology

## 2018-05-13 DIAGNOSIS — H4313 Vitreous hemorrhage, bilateral: Secondary | ICD-10-CM

## 2018-05-13 DIAGNOSIS — I10 Essential (primary) hypertension: Secondary | ICD-10-CM | POA: Diagnosis not present

## 2018-05-13 DIAGNOSIS — H35033 Hypertensive retinopathy, bilateral: Secondary | ICD-10-CM

## 2018-05-13 DIAGNOSIS — H353221 Exudative age-related macular degeneration, left eye, with active choroidal neovascularization: Secondary | ICD-10-CM | POA: Diagnosis not present

## 2018-05-13 DIAGNOSIS — H353112 Nonexudative age-related macular degeneration, right eye, intermediate dry stage: Secondary | ICD-10-CM | POA: Diagnosis not present

## 2018-07-09 ENCOUNTER — Encounter (INDEPENDENT_AMBULATORY_CARE_PROVIDER_SITE_OTHER): Payer: Medicare PPO | Admitting: Ophthalmology

## 2018-07-09 DIAGNOSIS — I10 Essential (primary) hypertension: Secondary | ICD-10-CM

## 2018-07-09 DIAGNOSIS — H353221 Exudative age-related macular degeneration, left eye, with active choroidal neovascularization: Secondary | ICD-10-CM

## 2018-07-09 DIAGNOSIS — H353112 Nonexudative age-related macular degeneration, right eye, intermediate dry stage: Secondary | ICD-10-CM | POA: Diagnosis not present

## 2018-07-09 DIAGNOSIS — H2513 Age-related nuclear cataract, bilateral: Secondary | ICD-10-CM

## 2018-07-09 DIAGNOSIS — H43813 Vitreous degeneration, bilateral: Secondary | ICD-10-CM

## 2018-07-09 DIAGNOSIS — H35033 Hypertensive retinopathy, bilateral: Secondary | ICD-10-CM | POA: Diagnosis not present

## 2018-09-03 ENCOUNTER — Encounter (INDEPENDENT_AMBULATORY_CARE_PROVIDER_SITE_OTHER): Payer: Medicare PPO | Admitting: Ophthalmology

## 2018-09-03 DIAGNOSIS — I10 Essential (primary) hypertension: Secondary | ICD-10-CM | POA: Diagnosis not present

## 2018-09-03 DIAGNOSIS — H2513 Age-related nuclear cataract, bilateral: Secondary | ICD-10-CM

## 2018-09-03 DIAGNOSIS — H353221 Exudative age-related macular degeneration, left eye, with active choroidal neovascularization: Secondary | ICD-10-CM

## 2018-09-03 DIAGNOSIS — H35033 Hypertensive retinopathy, bilateral: Secondary | ICD-10-CM | POA: Diagnosis not present

## 2018-09-03 DIAGNOSIS — H353112 Nonexudative age-related macular degeneration, right eye, intermediate dry stage: Secondary | ICD-10-CM | POA: Diagnosis not present

## 2018-09-03 DIAGNOSIS — H43813 Vitreous degeneration, bilateral: Secondary | ICD-10-CM

## 2018-10-28 ENCOUNTER — Encounter (INDEPENDENT_AMBULATORY_CARE_PROVIDER_SITE_OTHER): Payer: Medicare PPO | Admitting: Ophthalmology

## 2018-10-28 DIAGNOSIS — H43813 Vitreous degeneration, bilateral: Secondary | ICD-10-CM

## 2018-10-28 DIAGNOSIS — H353221 Exudative age-related macular degeneration, left eye, with active choroidal neovascularization: Secondary | ICD-10-CM | POA: Diagnosis not present

## 2018-10-28 DIAGNOSIS — H353112 Nonexudative age-related macular degeneration, right eye, intermediate dry stage: Secondary | ICD-10-CM | POA: Diagnosis not present

## 2018-10-28 DIAGNOSIS — H35033 Hypertensive retinopathy, bilateral: Secondary | ICD-10-CM

## 2018-10-28 DIAGNOSIS — I10 Essential (primary) hypertension: Secondary | ICD-10-CM

## 2018-10-28 DIAGNOSIS — H2513 Age-related nuclear cataract, bilateral: Secondary | ICD-10-CM

## 2018-12-23 ENCOUNTER — Encounter (INDEPENDENT_AMBULATORY_CARE_PROVIDER_SITE_OTHER): Payer: Medicare PPO | Admitting: Ophthalmology

## 2018-12-23 DIAGNOSIS — H353112 Nonexudative age-related macular degeneration, right eye, intermediate dry stage: Secondary | ICD-10-CM

## 2018-12-23 DIAGNOSIS — H35033 Hypertensive retinopathy, bilateral: Secondary | ICD-10-CM

## 2018-12-23 DIAGNOSIS — H353221 Exudative age-related macular degeneration, left eye, with active choroidal neovascularization: Secondary | ICD-10-CM | POA: Diagnosis not present

## 2018-12-23 DIAGNOSIS — I10 Essential (primary) hypertension: Secondary | ICD-10-CM | POA: Diagnosis not present

## 2018-12-23 DIAGNOSIS — H43813 Vitreous degeneration, bilateral: Secondary | ICD-10-CM

## 2018-12-23 DIAGNOSIS — H2513 Age-related nuclear cataract, bilateral: Secondary | ICD-10-CM

## 2019-02-17 ENCOUNTER — Other Ambulatory Visit: Payer: Self-pay

## 2019-02-17 ENCOUNTER — Encounter (INDEPENDENT_AMBULATORY_CARE_PROVIDER_SITE_OTHER): Payer: Medicare PPO | Admitting: Ophthalmology

## 2019-02-17 DIAGNOSIS — H353112 Nonexudative age-related macular degeneration, right eye, intermediate dry stage: Secondary | ICD-10-CM

## 2019-02-17 DIAGNOSIS — I10 Essential (primary) hypertension: Secondary | ICD-10-CM | POA: Diagnosis not present

## 2019-02-17 DIAGNOSIS — H353221 Exudative age-related macular degeneration, left eye, with active choroidal neovascularization: Secondary | ICD-10-CM

## 2019-02-17 DIAGNOSIS — H43813 Vitreous degeneration, bilateral: Secondary | ICD-10-CM

## 2019-02-17 DIAGNOSIS — H35033 Hypertensive retinopathy, bilateral: Secondary | ICD-10-CM | POA: Diagnosis not present

## 2019-02-17 DIAGNOSIS — H2513 Age-related nuclear cataract, bilateral: Secondary | ICD-10-CM

## 2019-04-07 ENCOUNTER — Other Ambulatory Visit: Payer: Self-pay

## 2019-04-07 ENCOUNTER — Ambulatory Visit (INDEPENDENT_AMBULATORY_CARE_PROVIDER_SITE_OTHER): Payer: Medicare PPO | Admitting: Cardiology

## 2019-04-07 ENCOUNTER — Encounter: Payer: Self-pay | Admitting: Cardiology

## 2019-04-07 VITALS — BP 143/82 | HR 92 | Ht 69.0 in | Wt 175.0 lb

## 2019-04-07 DIAGNOSIS — I10 Essential (primary) hypertension: Secondary | ICD-10-CM

## 2019-04-07 DIAGNOSIS — R0609 Other forms of dyspnea: Secondary | ICD-10-CM

## 2019-04-07 DIAGNOSIS — E782 Mixed hyperlipidemia: Secondary | ICD-10-CM | POA: Diagnosis not present

## 2019-04-07 DIAGNOSIS — R5383 Other fatigue: Secondary | ICD-10-CM

## 2019-04-07 MED ORDER — AMLODIPINE BESYLATE 10 MG PO TABS: 10.0000 mg | ORAL_TABLET | Freq: Every day | ORAL | 0 refills | Status: DC

## 2019-04-07 NOTE — Progress Notes (Signed)
Primary Physician/Referring:  Aida PufferLittle, James, MD  Patient ID: Mitchell Harris, male    DOB: Nov 17, 1941, 78 y.o.   MRN: [id number]  Chief Complaint  Patient presents with  . Shortness of Breath    PT C/O DOE  . Fatigue   This visit type was conducted due to national recommendations for restrictions regarding the COVID-19 Pandemic (e.g. social distancing).  This format is felt to be most appropriate for this patient at this time.  All issues noted in this document were discussed and addressed.  No physical exam was performed (except for noted visual exam findings with Telehealth visits).  The patient has consented to conduct a Telehealth visit and understands insurance will be billed.   I discussed the limitations of evaluation and management by telemedicine and the availability of in person appointments. The patient expressed understanding and agreed to proceed.  Virtual Visit via Video Note is as below  I connected with Mitchell Harris, on 04/07/19 at 0945 by telephone and verified that I am speaking with the correct person using two identifiers. Unable to perform video visit as patient did not have equipment.    I have discussed with him regarding the safety during COVID Pandemic and steps and precautions including social distancing with the patient.    HPI: Mitchell Harris  is a 78 y.o. male  with hypertension, hyperlipidemia, osteoarthritis, family history of heart disease referred by Allyn KennerHeather Gibson, NP for evaluation of fatigue and dyspnea on exertion.   With patients job, he is mostly sedentary the winter months and has noticed now that he has resumed working that he is more short of breath and fatigued at the end of the day. Symptoms have not been improving. He denies any exertional chest pain. No palpitations or leg edema.   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 He has 2 children [place]. He denies any former tobacco, alcohol, or drug use.   Past Medical History:   Diagnosis Date  . Anxiety   . Arthritis    OA- hands, knees, neck  . Complication of anesthesia   . Depression   . Family history of adverse reaction to anesthesia   . History of blood transfusion   . Hyperlipidemia   . Hypertension   . 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 Macular degeneration    left eye, rec'ing injecctions   . Neuromuscular disorder (HCC)    cervical degeneration  . PONV (postoperative nausea and vomiting)   . Primary localized osteoarthrosis of right shoulder 11/03/2015    Past Surgical History:  Procedure Laterality Date  . CERVICAL FUSION  2008  . CHOLECYSTECTOMY    . JOINT REPLACEMENT  2000&2007   both knees   . KNEE SURGERY     arthroscopic- both knees , L knee reconstruction prior to replacement   . SHOULDER ARTHROSCOPY     both shoulders   . TONSILLECTOMY    . 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 TOTAL SHOULDER ARTHROPLASTY Right 11/03/2015   Procedure: RIGHT TOTAL SHOULDER ARTHROPLASTY;  Surgeon: Teryl LucyJoshua Landau, MD;  Location: [facility];  Service: Orthopedics;  Laterality: Right;  ANESTHESIA:  GENERAL, PRE/POST OP SCALENE    Social History   Socioeconomic History  . Marital status: Single    Spouse name: Not on file  . Number of  children: Not on file  . Years of education: Not on file  . Highest education level: Not on file  Occupational History  . Not on file  Social Needs  . Financial resource strain: Not on file  . Food insecurity:    Worry: Not on file    Inability: Not on file  . Transportation needs:    Medical: Not on file    Non-medical: Not on file  Tobacco Use  . Smoking status: Never Smoker  . Smokeless tobacco: Never Used  Substance and Sexual Activity  . Alcohol use: No  . Drug use: No  . Sexual activity: Not on file  Lifestyle   . Physical activity:    Days per week: Not on file    Minutes per session: Not on file  . Stress: Not on file  Relationships  . Social connections:    Talks on phone: Not on file    Gets together: Not on file    Attends religious service: Not on file    Active member of club or organization: Not on file    Attends meetings of clubs or organizations: Not on file    Relationship status: Not on file  . Intimate partner violence:    Fear of current or ex partner: Not on file    Emotionally abused: Not on file    Physically abused: Not on file    Forced sexual activity: Not on file  Other Topics Concern  . Not on file  Social History Narrative  . Not on file    Current Outpatient Medications on File Prior to Visit  Medication Sig Dispense Refill  . atorvastatin (LIPITOR) 10 MG tablet Take 10 mg by mouth daily at 6 PM.     . celecoxib (CELEBREX) 100 MG capsule Take 100 mg by mouth daily.    Marland Kitchen losartan (COZAAR) 100 MG tablet Take 100 mg by mouth daily after supper.     . methocarbamol (ROBAXIN) 750 MG tablet Take 750 mg by mouth 4 (four) times daily.    Marland Kitchen oxyCODONE-acetaminophen (PERCOCET) 10-325 MG tablet Take 1-2 tablets by mouth every 6 (six) hours as needed for pain. MAXIMUM TOTAL ACETAMINOPHEN DOSE IS 4000 MG PER DAY 75 tablet 0  . tamsulosin (FLOMAX) 0.4 MG CAPS capsule Take 0.4 mg by mouth daily after supper.     . zolpidem (AMBIEN) 10 MG tablet Take 10 mg by mouth at bedtime.    Marland Kitchen HYDROmorphone (DILAUDID) 2 MG tablet Take 1 tablet (2 mg total) by mouth every 4 (four) hours as needed for severe pain. (Patient not taking: Reported on 04/07/2019) 50 tablet 0  . meloxicam (MOBIC) 15 MG tablet Take 1 tablet (15 mg total) by mouth daily. (Patient not taking: Reported on 04/07/2019) 30 tablet 0  . ondansetron (ZOFRAN) 4 MG tablet Take 1 tablet (4 mg total) by mouth every 8 (eight) hours as needed for nausea or vomiting. (Patient not taking: Reported on 04/07/2019) 30 tablet 0  .  sennosides-docusate sodium (SENOKOT-S) 8.6-50 MG tablet Take 2 tablets by mouth daily. (Patient not taking: Reported on 04/07/2019) 30 tablet 1   Current Facility-Administered Medications on File Prior to Visit  Medication Dose Route Frequency Provider Last Rate Last Dose  . triamcinolone acetonide (KENALOG) 10 MG/ML injection 10 mg  10 mg Other Once Asencion Islam, DPM        Review of Systems  Constitution: Positive for malaise/fatigue. Negative for decreased appetite, weight gain and weight loss.  Eyes:  Negative for visual disturbance.  Cardiovascular: Positive for dyspnea on exertion. Negative for chest pain, claudication, leg swelling, orthopnea, palpitations and syncope.  Respiratory: Negative for hemoptysis, snoring and wheezing.   Endocrine: Negative for cold intolerance and heat intolerance.  Hematologic/Lymphatic: Does not bruise/bleed easily.  Skin: Negative for nail changes.  Musculoskeletal: Positive for back pain and muscle weakness (mostly notices after working). Negative for myalgias.  Gastrointestinal: Negative for abdominal pain, change in bowel habit, nausea and vomiting.  Neurological: Negative for difficulty with concentration, dizziness, focal weakness and headaches.  Psychiatric/Behavioral: Negative for altered mental status and suicidal ideas.  All other systems reviewed and are negative.     Objective  Blood pressure (!) 143/82, pulse 92, height  (1.753 m), weight 175 lb (79.4 kg). Body mass index is 25.84 kg/m.    Physical exam not performed or limited due to virtual visit. Please see exam details from prior visit is as below.   Radiology: No results found.  Laboratory examination:    CMP Latest Ref Rng & Units 10/24/2015 11/02/2014 10/20/2014  Glucose 65 - 99 mg/dL 409(W) 119(J) 478(G)  BUN 6 - 20 mg/dL 95(A) 21 22  Creatinine 0.61 - 1.24 mg/dL 2.13 0.86 5.78  Sodium 135 - 145 mmol/L 137 140 141  Potassium 3.5 - 5.1 mmol/L 4.8 4.3 4.7  Chloride  101 - 111 mmol/L 106 105 106  CO2 22 - 32 mmol/L 23 17(L) 19  Calcium 8.9 - 10.3 mg/dL 9.6 9.4 9.6  Total Protein - - - -  Total Bilirubin - - - -  Alkaline Phos - - - -  AST - - - -  ALT - - - -   CBC Latest Ref Rng & Units 10/24/2015 11/02/2014 10/20/2014  WBC 4.0 - 10.5 K/uL 6.6 16.0(H) 7.5  Hemoglobin 13.0 - 17.0 g/dL 46.9 11.2(L) 12.8(L)  Hematocrit 39.0 - 52.0 % 40.8 34.4(L) 39.0  Platelets 150 - 400 K/uL 239 242 261   Lipid Panel  No results found for: CHOL, TRIG, HDL, CHOLHDL, VLDL, LDLCALC, LDLDIRECT HEMOGLOBIN A1C No results found for: HGBA1C, MPG TSH No results for input(s): TSH in the last 8760 hours.  Cardiac Studies:     Assessment   Dyspnea on exertion - Plan: PCV CARDIAC STRESS TEST, PCV ECHOCARDIOGRAM COMPLETE  Fatigue, unspecified type - Plan: PCV CARDIAC STRESS TEST  Essential hypertension  Mixed hyperlipidemia  EKG 10/14/2015: Normal sinus rhythm at 80 bpm, normal axis, no evidence of ischemia.   Recommendations:   Patient has noticed dyspnea on exertion and fatigue over the last few months with previously well tolerated activities. No chest pain. He has history of hypertension and hyperlipidemia. Hypertension is uncontrolled. Will increase amlodipine to 20 mg for now. Could consider beta blocker if additional therapy is needed. In view of symptoms and risk factors, will obtain treadmill stress test for further evaluation. Also obtain echocardiogram to exclude any structural abnormalities.   He reports recently having labs performed including lipids, will request copy for further risk stratification. He admits that his diet could be better. Discussed diet modifications. Will see him in the office after the test for further recommendations.    Toniann Fail, MSN, APRN, FNP-C [facility] Office: [phone] Fax: [phone]

## 2019-04-14 ENCOUNTER — Encounter (INDEPENDENT_AMBULATORY_CARE_PROVIDER_SITE_OTHER): Payer: Medicare PPO | Admitting: Ophthalmology

## 2019-04-14 ENCOUNTER — Other Ambulatory Visit: Payer: Self-pay

## 2019-04-14 DIAGNOSIS — H35033 Hypertensive retinopathy, bilateral: Secondary | ICD-10-CM

## 2019-04-14 DIAGNOSIS — H2513 Age-related nuclear cataract, bilateral: Secondary | ICD-10-CM

## 2019-04-14 DIAGNOSIS — H353231 Exudative age-related macular degeneration, bilateral, with active choroidal neovascularization: Secondary | ICD-10-CM

## 2019-04-14 DIAGNOSIS — I10 Essential (primary) hypertension: Secondary | ICD-10-CM

## 2019-04-14 DIAGNOSIS — H43813 Vitreous degeneration, bilateral: Secondary | ICD-10-CM

## 2019-05-12 ENCOUNTER — Encounter (INDEPENDENT_AMBULATORY_CARE_PROVIDER_SITE_OTHER): Payer: Medicare PPO | Admitting: Ophthalmology

## 2019-05-12 ENCOUNTER — Other Ambulatory Visit: Payer: Self-pay

## 2019-05-12 DIAGNOSIS — H353231 Exudative age-related macular degeneration, bilateral, with active choroidal neovascularization: Secondary | ICD-10-CM

## 2019-05-12 DIAGNOSIS — H35033 Hypertensive retinopathy, bilateral: Secondary | ICD-10-CM

## 2019-05-12 DIAGNOSIS — H43813 Vitreous degeneration, bilateral: Secondary | ICD-10-CM | POA: Diagnosis not present

## 2019-05-12 DIAGNOSIS — I10 Essential (primary) hypertension: Secondary | ICD-10-CM | POA: Diagnosis not present

## 2019-05-12 DIAGNOSIS — H2513 Age-related nuclear cataract, bilateral: Secondary | ICD-10-CM

## 2019-05-13 ENCOUNTER — Other Ambulatory Visit: Payer: Medicare PPO

## 2019-05-21 ENCOUNTER — Ambulatory Visit: Payer: Medicare PPO | Admitting: Cardiology

## 2019-06-09 ENCOUNTER — Encounter (INDEPENDENT_AMBULATORY_CARE_PROVIDER_SITE_OTHER): Payer: Medicare PPO | Admitting: Ophthalmology

## 2019-06-09 ENCOUNTER — Other Ambulatory Visit: Payer: Self-pay

## 2019-06-09 DIAGNOSIS — I10 Essential (primary) hypertension: Secondary | ICD-10-CM

## 2019-06-09 DIAGNOSIS — H35033 Hypertensive retinopathy, bilateral: Secondary | ICD-10-CM | POA: Diagnosis not present

## 2019-06-09 DIAGNOSIS — H353231 Exudative age-related macular degeneration, bilateral, with active choroidal neovascularization: Secondary | ICD-10-CM | POA: Diagnosis not present

## 2019-06-09 DIAGNOSIS — H43813 Vitreous degeneration, bilateral: Secondary | ICD-10-CM

## 2019-06-09 DIAGNOSIS — H2513 Age-related nuclear cataract, bilateral: Secondary | ICD-10-CM

## 2019-06-30 ENCOUNTER — Other Ambulatory Visit: Payer: Self-pay | Admitting: Cardiology

## 2019-07-08 ENCOUNTER — Encounter (INDEPENDENT_AMBULATORY_CARE_PROVIDER_SITE_OTHER): Payer: Medicare PPO | Admitting: Ophthalmology

## 2019-07-15 ENCOUNTER — Encounter (INDEPENDENT_AMBULATORY_CARE_PROVIDER_SITE_OTHER): Payer: Medicare PPO | Admitting: Ophthalmology

## 2019-07-15 ENCOUNTER — Other Ambulatory Visit: Payer: Self-pay

## 2019-07-15 DIAGNOSIS — H353231 Exudative age-related macular degeneration, bilateral, with active choroidal neovascularization: Secondary | ICD-10-CM

## 2019-07-15 DIAGNOSIS — H2513 Age-related nuclear cataract, bilateral: Secondary | ICD-10-CM

## 2019-07-15 DIAGNOSIS — I10 Essential (primary) hypertension: Secondary | ICD-10-CM | POA: Diagnosis not present

## 2019-07-15 DIAGNOSIS — H43813 Vitreous degeneration, bilateral: Secondary | ICD-10-CM

## 2019-07-15 DIAGNOSIS — H35033 Hypertensive retinopathy, bilateral: Secondary | ICD-10-CM

## 2019-08-19 ENCOUNTER — Encounter (INDEPENDENT_AMBULATORY_CARE_PROVIDER_SITE_OTHER): Payer: Medicare PPO | Admitting: Ophthalmology

## 2019-08-19 ENCOUNTER — Other Ambulatory Visit: Payer: Self-pay

## 2019-08-19 DIAGNOSIS — H353231 Exudative age-related macular degeneration, bilateral, with active choroidal neovascularization: Secondary | ICD-10-CM | POA: Diagnosis not present

## 2019-08-19 DIAGNOSIS — I10 Essential (primary) hypertension: Secondary | ICD-10-CM

## 2019-08-19 DIAGNOSIS — H35033 Hypertensive retinopathy, bilateral: Secondary | ICD-10-CM

## 2019-08-19 DIAGNOSIS — H2513 Age-related nuclear cataract, bilateral: Secondary | ICD-10-CM

## 2019-08-19 DIAGNOSIS — H43813 Vitreous degeneration, bilateral: Secondary | ICD-10-CM

## 2019-09-21 ENCOUNTER — Encounter (INDEPENDENT_AMBULATORY_CARE_PROVIDER_SITE_OTHER): Payer: Medicare PPO | Admitting: Ophthalmology

## 2019-09-21 ENCOUNTER — Other Ambulatory Visit: Payer: Self-pay

## 2019-09-21 DIAGNOSIS — H2513 Age-related nuclear cataract, bilateral: Secondary | ICD-10-CM

## 2019-09-21 DIAGNOSIS — H43813 Vitreous degeneration, bilateral: Secondary | ICD-10-CM | POA: Diagnosis not present

## 2019-09-21 DIAGNOSIS — I10 Essential (primary) hypertension: Secondary | ICD-10-CM | POA: Diagnosis not present

## 2019-09-21 DIAGNOSIS — H353231 Exudative age-related macular degeneration, bilateral, with active choroidal neovascularization: Secondary | ICD-10-CM | POA: Diagnosis not present

## 2019-09-21 DIAGNOSIS — H35033 Hypertensive retinopathy, bilateral: Secondary | ICD-10-CM

## 2019-10-26 ENCOUNTER — Encounter (INDEPENDENT_AMBULATORY_CARE_PROVIDER_SITE_OTHER): Payer: Medicare PPO | Admitting: Ophthalmology

## 2019-10-26 DIAGNOSIS — H35033 Hypertensive retinopathy, bilateral: Secondary | ICD-10-CM

## 2019-10-26 DIAGNOSIS — I10 Essential (primary) hypertension: Secondary | ICD-10-CM | POA: Diagnosis not present

## 2019-10-26 DIAGNOSIS — H353231 Exudative age-related macular degeneration, bilateral, with active choroidal neovascularization: Secondary | ICD-10-CM | POA: Diagnosis not present

## 2019-10-26 DIAGNOSIS — H43813 Vitreous degeneration, bilateral: Secondary | ICD-10-CM | POA: Diagnosis not present

## 2019-11-24 ENCOUNTER — Encounter (INDEPENDENT_AMBULATORY_CARE_PROVIDER_SITE_OTHER): Payer: Medicare PPO | Admitting: Ophthalmology

## 2019-11-24 DIAGNOSIS — H35033 Hypertensive retinopathy, bilateral: Secondary | ICD-10-CM | POA: Diagnosis not present

## 2019-11-24 DIAGNOSIS — H43813 Vitreous degeneration, bilateral: Secondary | ICD-10-CM

## 2019-11-24 DIAGNOSIS — H2513 Age-related nuclear cataract, bilateral: Secondary | ICD-10-CM

## 2019-11-24 DIAGNOSIS — I10 Essential (primary) hypertension: Secondary | ICD-10-CM

## 2019-11-24 DIAGNOSIS — H353231 Exudative age-related macular degeneration, bilateral, with active choroidal neovascularization: Secondary | ICD-10-CM | POA: Diagnosis not present

## 2020-01-03 ENCOUNTER — Encounter (INDEPENDENT_AMBULATORY_CARE_PROVIDER_SITE_OTHER): Payer: Medicare PPO | Admitting: Ophthalmology

## 2020-01-03 DIAGNOSIS — H43813 Vitreous degeneration, bilateral: Secondary | ICD-10-CM | POA: Diagnosis not present

## 2020-01-03 DIAGNOSIS — I10 Essential (primary) hypertension: Secondary | ICD-10-CM

## 2020-01-03 DIAGNOSIS — H353231 Exudative age-related macular degeneration, bilateral, with active choroidal neovascularization: Secondary | ICD-10-CM

## 2020-01-03 DIAGNOSIS — H35033 Hypertensive retinopathy, bilateral: Secondary | ICD-10-CM

## 2020-01-03 DIAGNOSIS — H2513 Age-related nuclear cataract, bilateral: Secondary | ICD-10-CM

## 2020-01-27 ENCOUNTER — Ambulatory Visit: Payer: Medicare PPO | Attending: Internal Medicine

## 2020-01-27 DIAGNOSIS — Z23 Encounter for immunization: Secondary | ICD-10-CM

## 2020-01-27 NOTE — Progress Notes (Addendum)
   Covid-19 Vaccination Clinic  Name:  Mitchell Harris    MRN: [id number] DOB: [birth year]  01/27/2020  [name] was observed post Covid-19 immunization for 15 minutes and when patient was walking to checkout noted to be stubbling. Placed patient in chair, pt pale and slightly clammy. This was noted to occur at 1325p, EMS notified and at patient side obtained manual BP of 80. Patient refused transport to hospital but did  agree to go to EMS room for further evaluation. Patient states that he has family in [place], [place] and drove himself. Discussed need for further evaluation at hospital and patient continued to decline transport. Awaiting follow up from EMS at 1345p  At 1412, spoke with EMS and patient refused transport to hospital after further evaluation. He was released by EMS at that point.    He was provided with Vaccine Information Sheet and instruction to access the V-Safe system.   [name] was instructed to call 911 with any severe reactions post vaccine: Marland Kitchen Difficulty breathing  . Swelling of your face and throat  . A fast heartbeat  . A bad rash all over your body  . Dizziness and weakness    Immunizations Administered    Name Date Dose VIS Date Route   Pfizer COVID-19 Vaccine 01/27/2020  1:01 PM 0.3 mL 11/12/2019 Intramuscular   Manufacturer: [employer], [employer]   Lot: [id number]   NDC: [address]-2

## 2020-02-07 ENCOUNTER — Encounter (INDEPENDENT_AMBULATORY_CARE_PROVIDER_SITE_OTHER): Payer: Medicare PPO | Admitting: Ophthalmology

## 2020-02-07 DIAGNOSIS — H35033 Hypertensive retinopathy, bilateral: Secondary | ICD-10-CM | POA: Diagnosis not present

## 2020-02-07 DIAGNOSIS — H2513 Age-related nuclear cataract, bilateral: Secondary | ICD-10-CM

## 2020-02-07 DIAGNOSIS — I10 Essential (primary) hypertension: Secondary | ICD-10-CM | POA: Diagnosis not present

## 2020-02-07 DIAGNOSIS — H43813 Vitreous degeneration, bilateral: Secondary | ICD-10-CM | POA: Diagnosis not present

## 2020-02-07 DIAGNOSIS — H353231 Exudative age-related macular degeneration, bilateral, with active choroidal neovascularization: Secondary | ICD-10-CM | POA: Diagnosis not present

## 2020-02-16 ENCOUNTER — Ambulatory Visit: Payer: Medicare PPO | Attending: Internal Medicine

## 2020-02-16 DIAGNOSIS — Z23 Encounter for immunization: Secondary | ICD-10-CM

## 2020-02-16 NOTE — Progress Notes (Signed)
   Covid-19 Vaccination Clinic  Name:  Mitchell Harris    MRN: [id number] DOB: October 20, 1941  02/16/2020  Mitchell Harris was observed post Covid-19 immunization for 15 minutes without incident. He was provided with Vaccine Information Sheet and instruction to access the V-Safe system.   Mitchell Harris was instructed to call 911 with any severe reactions post vaccine: Marland Kitchen Difficulty breathing  . Swelling of face and throat  . A fast heartbeat  . A bad rash all over body  . Dizziness and weakness   Immunizations Administered    Name Date Dose VIS Date Route   Pfizer COVID-19 Vaccine 02/16/2020  1:38 PM 0.3 mL 11/12/2019 Intramuscular   Manufacturer: [employer], [employer]   Lot: [id number]   NDC: [id number]

## 2020-03-13 ENCOUNTER — Encounter (INDEPENDENT_AMBULATORY_CARE_PROVIDER_SITE_OTHER): Payer: Medicare PPO | Admitting: Ophthalmology

## 2020-04-07 ENCOUNTER — Encounter (INDEPENDENT_AMBULATORY_CARE_PROVIDER_SITE_OTHER): Payer: Medicare PPO | Admitting: Ophthalmology

## 2020-04-07 DIAGNOSIS — I10 Essential (primary) hypertension: Secondary | ICD-10-CM | POA: Diagnosis not present

## 2020-04-07 DIAGNOSIS — H43813 Vitreous degeneration, bilateral: Secondary | ICD-10-CM

## 2020-04-07 DIAGNOSIS — H353231 Exudative age-related macular degeneration, bilateral, with active choroidal neovascularization: Secondary | ICD-10-CM | POA: Diagnosis not present

## 2020-04-07 DIAGNOSIS — H35033 Hypertensive retinopathy, bilateral: Secondary | ICD-10-CM

## 2020-05-12 ENCOUNTER — Other Ambulatory Visit: Payer: Self-pay

## 2020-05-12 ENCOUNTER — Encounter (INDEPENDENT_AMBULATORY_CARE_PROVIDER_SITE_OTHER): Payer: Medicare PPO | Admitting: Ophthalmology

## 2020-05-12 DIAGNOSIS — H43813 Vitreous degeneration, bilateral: Secondary | ICD-10-CM | POA: Diagnosis not present

## 2020-05-12 DIAGNOSIS — H35033 Hypertensive retinopathy, bilateral: Secondary | ICD-10-CM | POA: Diagnosis not present

## 2020-05-12 DIAGNOSIS — H353231 Exudative age-related macular degeneration, bilateral, with active choroidal neovascularization: Secondary | ICD-10-CM

## 2020-05-12 DIAGNOSIS — H2513 Age-related nuclear cataract, bilateral: Secondary | ICD-10-CM

## 2020-05-12 DIAGNOSIS — I10 Essential (primary) hypertension: Secondary | ICD-10-CM | POA: Diagnosis not present

## 2020-06-19 ENCOUNTER — Encounter (INDEPENDENT_AMBULATORY_CARE_PROVIDER_SITE_OTHER): Payer: Medicare PPO | Admitting: Ophthalmology

## 2020-06-19 ENCOUNTER — Other Ambulatory Visit: Payer: Self-pay

## 2020-06-19 DIAGNOSIS — H43813 Vitreous degeneration, bilateral: Secondary | ICD-10-CM | POA: Diagnosis not present

## 2020-06-19 DIAGNOSIS — H353231 Exudative age-related macular degeneration, bilateral, with active choroidal neovascularization: Secondary | ICD-10-CM | POA: Diagnosis not present

## 2020-06-19 DIAGNOSIS — I10 Essential (primary) hypertension: Secondary | ICD-10-CM

## 2020-06-19 DIAGNOSIS — H35033 Hypertensive retinopathy, bilateral: Secondary | ICD-10-CM | POA: Diagnosis not present

## 2020-07-24 ENCOUNTER — Encounter (INDEPENDENT_AMBULATORY_CARE_PROVIDER_SITE_OTHER): Payer: Medicare PPO | Admitting: Ophthalmology

## 2020-07-24 ENCOUNTER — Other Ambulatory Visit: Payer: Self-pay

## 2020-07-24 DIAGNOSIS — H353231 Exudative age-related macular degeneration, bilateral, with active choroidal neovascularization: Secondary | ICD-10-CM | POA: Diagnosis not present

## 2020-07-24 DIAGNOSIS — H43813 Vitreous degeneration, bilateral: Secondary | ICD-10-CM

## 2020-07-24 DIAGNOSIS — H35033 Hypertensive retinopathy, bilateral: Secondary | ICD-10-CM

## 2020-07-24 DIAGNOSIS — I10 Essential (primary) hypertension: Secondary | ICD-10-CM | POA: Diagnosis not present

## 2020-08-29 ENCOUNTER — Encounter (INDEPENDENT_AMBULATORY_CARE_PROVIDER_SITE_OTHER): Payer: Medicare PPO | Admitting: Ophthalmology

## 2020-08-29 ENCOUNTER — Other Ambulatory Visit: Payer: Self-pay

## 2020-08-29 DIAGNOSIS — I10 Essential (primary) hypertension: Secondary | ICD-10-CM

## 2020-08-29 DIAGNOSIS — H43813 Vitreous degeneration, bilateral: Secondary | ICD-10-CM

## 2020-08-29 DIAGNOSIS — H35033 Hypertensive retinopathy, bilateral: Secondary | ICD-10-CM | POA: Diagnosis not present

## 2020-08-29 DIAGNOSIS — H353231 Exudative age-related macular degeneration, bilateral, with active choroidal neovascularization: Secondary | ICD-10-CM | POA: Diagnosis not present

## 2020-10-03 ENCOUNTER — Other Ambulatory Visit: Payer: Self-pay

## 2020-10-03 ENCOUNTER — Encounter (INDEPENDENT_AMBULATORY_CARE_PROVIDER_SITE_OTHER): Payer: Medicare PPO | Admitting: Ophthalmology

## 2020-10-03 DIAGNOSIS — H35033 Hypertensive retinopathy, bilateral: Secondary | ICD-10-CM

## 2020-10-03 DIAGNOSIS — I10 Essential (primary) hypertension: Secondary | ICD-10-CM

## 2020-10-03 DIAGNOSIS — H43813 Vitreous degeneration, bilateral: Secondary | ICD-10-CM | POA: Diagnosis not present

## 2020-10-03 DIAGNOSIS — H353231 Exudative age-related macular degeneration, bilateral, with active choroidal neovascularization: Secondary | ICD-10-CM | POA: Diagnosis not present

## 2020-11-14 ENCOUNTER — Other Ambulatory Visit: Payer: Self-pay

## 2020-11-14 ENCOUNTER — Encounter (INDEPENDENT_AMBULATORY_CARE_PROVIDER_SITE_OTHER): Payer: Medicare PPO | Admitting: Ophthalmology

## 2020-11-14 DIAGNOSIS — H35033 Hypertensive retinopathy, bilateral: Secondary | ICD-10-CM | POA: Diagnosis not present

## 2020-11-14 DIAGNOSIS — H353231 Exudative age-related macular degeneration, bilateral, with active choroidal neovascularization: Secondary | ICD-10-CM

## 2020-11-14 DIAGNOSIS — I10 Essential (primary) hypertension: Secondary | ICD-10-CM | POA: Diagnosis not present

## 2020-11-14 DIAGNOSIS — H43813 Vitreous degeneration, bilateral: Secondary | ICD-10-CM

## 2020-12-26 ENCOUNTER — Other Ambulatory Visit: Payer: Self-pay

## 2020-12-26 ENCOUNTER — Encounter (INDEPENDENT_AMBULATORY_CARE_PROVIDER_SITE_OTHER): Payer: Medicare Other | Admitting: Ophthalmology

## 2021-01-08 ENCOUNTER — Other Ambulatory Visit: Payer: Self-pay

## 2021-01-08 ENCOUNTER — Encounter (INDEPENDENT_AMBULATORY_CARE_PROVIDER_SITE_OTHER): Payer: Medicare HMO | Admitting: Ophthalmology

## 2021-01-08 DIAGNOSIS — H353231 Exudative age-related macular degeneration, bilateral, with active choroidal neovascularization: Secondary | ICD-10-CM | POA: Diagnosis not present

## 2021-01-08 DIAGNOSIS — I10 Essential (primary) hypertension: Secondary | ICD-10-CM

## 2021-01-08 DIAGNOSIS — H35033 Hypertensive retinopathy, bilateral: Secondary | ICD-10-CM | POA: Diagnosis not present

## 2021-01-08 DIAGNOSIS — H43813 Vitreous degeneration, bilateral: Secondary | ICD-10-CM

## 2021-02-05 ENCOUNTER — Encounter (INDEPENDENT_AMBULATORY_CARE_PROVIDER_SITE_OTHER): Payer: Medicare HMO | Admitting: Ophthalmology

## 2021-02-05 ENCOUNTER — Other Ambulatory Visit: Payer: Self-pay

## 2021-02-05 DIAGNOSIS — H43813 Vitreous degeneration, bilateral: Secondary | ICD-10-CM | POA: Diagnosis not present

## 2021-02-05 DIAGNOSIS — I10 Essential (primary) hypertension: Secondary | ICD-10-CM

## 2021-02-05 DIAGNOSIS — H353231 Exudative age-related macular degeneration, bilateral, with active choroidal neovascularization: Secondary | ICD-10-CM | POA: Diagnosis not present

## 2021-02-05 DIAGNOSIS — H35033 Hypertensive retinopathy, bilateral: Secondary | ICD-10-CM | POA: Diagnosis not present

## 2021-03-12 ENCOUNTER — Encounter (INDEPENDENT_AMBULATORY_CARE_PROVIDER_SITE_OTHER): Payer: Medicare HMO | Admitting: Ophthalmology

## 2021-03-13 ENCOUNTER — Encounter (INDEPENDENT_AMBULATORY_CARE_PROVIDER_SITE_OTHER): Payer: Medicare HMO | Admitting: Ophthalmology

## 2021-03-13 ENCOUNTER — Other Ambulatory Visit: Payer: Self-pay

## 2021-03-13 DIAGNOSIS — H35033 Hypertensive retinopathy, bilateral: Secondary | ICD-10-CM

## 2021-03-13 DIAGNOSIS — H353231 Exudative age-related macular degeneration, bilateral, with active choroidal neovascularization: Secondary | ICD-10-CM

## 2021-03-13 DIAGNOSIS — I10 Essential (primary) hypertension: Secondary | ICD-10-CM

## 2021-03-13 DIAGNOSIS — H43813 Vitreous degeneration, bilateral: Secondary | ICD-10-CM | POA: Diagnosis not present

## 2021-04-17 ENCOUNTER — Encounter (INDEPENDENT_AMBULATORY_CARE_PROVIDER_SITE_OTHER): Payer: Medicare HMO | Admitting: Ophthalmology

## 2021-04-24 ENCOUNTER — Encounter (INDEPENDENT_AMBULATORY_CARE_PROVIDER_SITE_OTHER): Payer: Medicare HMO | Admitting: Ophthalmology

## 2021-04-24 ENCOUNTER — Other Ambulatory Visit: Payer: Self-pay

## 2021-04-24 DIAGNOSIS — I10 Essential (primary) hypertension: Secondary | ICD-10-CM | POA: Diagnosis not present

## 2021-04-24 DIAGNOSIS — H35033 Hypertensive retinopathy, bilateral: Secondary | ICD-10-CM

## 2021-04-24 DIAGNOSIS — H43813 Vitreous degeneration, bilateral: Secondary | ICD-10-CM | POA: Diagnosis not present

## 2021-04-24 DIAGNOSIS — H353231 Exudative age-related macular degeneration, bilateral, with active choroidal neovascularization: Secondary | ICD-10-CM | POA: Diagnosis not present

## 2021-05-30 ENCOUNTER — Encounter (INDEPENDENT_AMBULATORY_CARE_PROVIDER_SITE_OTHER): Payer: Medicare HMO | Admitting: Ophthalmology

## 2021-05-31 ENCOUNTER — Other Ambulatory Visit: Payer: Self-pay

## 2021-05-31 ENCOUNTER — Encounter (INDEPENDENT_AMBULATORY_CARE_PROVIDER_SITE_OTHER): Payer: Medicare HMO | Admitting: Ophthalmology

## 2021-05-31 DIAGNOSIS — H353231 Exudative age-related macular degeneration, bilateral, with active choroidal neovascularization: Secondary | ICD-10-CM

## 2021-05-31 DIAGNOSIS — H35033 Hypertensive retinopathy, bilateral: Secondary | ICD-10-CM | POA: Diagnosis not present

## 2021-05-31 DIAGNOSIS — H43813 Vitreous degeneration, bilateral: Secondary | ICD-10-CM | POA: Diagnosis not present

## 2021-05-31 DIAGNOSIS — I10 Essential (primary) hypertension: Secondary | ICD-10-CM

## 2021-07-12 ENCOUNTER — Encounter (INDEPENDENT_AMBULATORY_CARE_PROVIDER_SITE_OTHER): Payer: Medicare HMO | Admitting: Ophthalmology

## 2021-07-19 ENCOUNTER — Encounter (INDEPENDENT_AMBULATORY_CARE_PROVIDER_SITE_OTHER): Payer: Medicare HMO | Admitting: Ophthalmology

## 2021-07-31 ENCOUNTER — Other Ambulatory Visit: Payer: Self-pay

## 2021-07-31 ENCOUNTER — Encounter (INDEPENDENT_AMBULATORY_CARE_PROVIDER_SITE_OTHER): Payer: Medicare HMO | Admitting: Ophthalmology

## 2021-07-31 DIAGNOSIS — H43813 Vitreous degeneration, bilateral: Secondary | ICD-10-CM

## 2021-07-31 DIAGNOSIS — I10 Essential (primary) hypertension: Secondary | ICD-10-CM

## 2021-07-31 DIAGNOSIS — H35033 Hypertensive retinopathy, bilateral: Secondary | ICD-10-CM

## 2021-07-31 DIAGNOSIS — H353231 Exudative age-related macular degeneration, bilateral, with active choroidal neovascularization: Secondary | ICD-10-CM

## 2021-09-11 ENCOUNTER — Encounter (INDEPENDENT_AMBULATORY_CARE_PROVIDER_SITE_OTHER): Payer: Medicare HMO | Admitting: Ophthalmology

## 2021-10-17 ENCOUNTER — Encounter (INDEPENDENT_AMBULATORY_CARE_PROVIDER_SITE_OTHER): Payer: Medicare HMO | Admitting: Ophthalmology

## 2021-10-17 ENCOUNTER — Other Ambulatory Visit: Payer: Self-pay

## 2021-10-17 DIAGNOSIS — H35033 Hypertensive retinopathy, bilateral: Secondary | ICD-10-CM | POA: Diagnosis not present

## 2021-10-17 DIAGNOSIS — H43813 Vitreous degeneration, bilateral: Secondary | ICD-10-CM | POA: Diagnosis not present

## 2021-10-17 DIAGNOSIS — H353231 Exudative age-related macular degeneration, bilateral, with active choroidal neovascularization: Secondary | ICD-10-CM

## 2021-10-17 DIAGNOSIS — I10 Essential (primary) hypertension: Secondary | ICD-10-CM | POA: Diagnosis not present

## 2021-11-22 ENCOUNTER — Encounter (INDEPENDENT_AMBULATORY_CARE_PROVIDER_SITE_OTHER): Payer: Medicare HMO | Admitting: Ophthalmology

## 2021-12-24 ENCOUNTER — Encounter (INDEPENDENT_AMBULATORY_CARE_PROVIDER_SITE_OTHER): Payer: Medicare HMO | Admitting: Ophthalmology

## 2021-12-24 ENCOUNTER — Encounter (INDEPENDENT_AMBULATORY_CARE_PROVIDER_SITE_OTHER): Payer: Self-pay

## 2022-01-26 ENCOUNTER — Emergency Department (HOSPITAL_COMMUNITY): Payer: Medicare HMO

## 2022-01-26 ENCOUNTER — Emergency Department (HOSPITAL_COMMUNITY)
Admission: EM | Admit: 2022-01-26 | Discharge: 2022-01-26 | Disposition: A | Discharge status: Home / Self Care | Payer: Medicare HMO | Attending: Emergency Medicine | Admitting: Emergency Medicine

## 2022-01-26 ENCOUNTER — Other Ambulatory Visit: Payer: Self-pay

## 2022-01-26 ENCOUNTER — Encounter (HOSPITAL_COMMUNITY): Payer: Self-pay | Admitting: *Deleted

## 2022-01-26 DIAGNOSIS — I6782 Cerebral ischemia: Secondary | ICD-10-CM | POA: Diagnosis not present

## 2022-01-26 DIAGNOSIS — H5509 Other forms of nystagmus: Secondary | ICD-10-CM | POA: Insufficient documentation

## 2022-01-26 DIAGNOSIS — Z79899 Other long term (current) drug therapy: Secondary | ICD-10-CM | POA: Insufficient documentation

## 2022-01-26 DIAGNOSIS — R269 Unspecified abnormalities of gait and mobility: Secondary | ICD-10-CM

## 2022-01-26 DIAGNOSIS — R2689 Other abnormalities of gait and mobility: Secondary | ICD-10-CM | POA: Diagnosis not present

## 2022-01-26 LAB — CBC WITH DIFFERENTIAL/PLATELET
Abs Immature Granulocytes: 0.06 10*3/uL (ref 0.00–0.07)
Basophils Absolute: 0.1 10*3/uL (ref 0.0–0.1)
Basophils Relative: 1 %
Eosinophils Absolute: 0.6 10*3/uL — ABNORMAL HIGH (ref 0.0–0.5)
Eosinophils Relative: 7 %
HCT: 41.2 % (ref 39.0–52.0)
Hemoglobin: 13.6 g/dL (ref 13.0–17.0)
Immature Granulocytes: 1 %
Lymphocytes Relative: 24 %
Lymphs Abs: 2.2 10*3/uL (ref 0.7–4.0)
MCH: 30.9 pg (ref 26.0–34.0)
MCHC: 33 g/dL (ref 30.0–36.0)
MCV: 93.6 fL (ref 80.0–100.0)
Monocytes Absolute: 0.8 10*3/uL (ref 0.1–1.0)
Monocytes Relative: 9 %
Neutro Abs: 5.3 10*3/uL (ref 1.7–7.7)
Neutrophils Relative %: 58 %
Platelets: 295 10*3/uL (ref 150–400)
RBC: 4.4 MIL/uL (ref 4.22–5.81)
RDW: 12.7 % (ref 11.5–15.5)
WBC: 8.9 10*3/uL (ref 4.0–10.5)
nRBC: 0 % (ref 0.0–0.2)

## 2022-01-26 LAB — COMPREHENSIVE METABOLIC PANEL
ALT: 12 U/L (ref 0–44)
AST: 17 U/L (ref 15–41)
Albumin: 3.7 g/dL (ref 3.5–5.0)
Alkaline Phosphatase: 72 U/L (ref 38–126)
Anion gap: 7 (ref 5–15)
BUN: 10 mg/dL (ref 8–23)
CO2: 21 mmol/L — ABNORMAL LOW (ref 22–32)
Calcium: 9.5 mg/dL (ref 8.9–10.3)
Chloride: 110 mmol/L (ref 98–111)
Creatinine, Ser: 0.8 mg/dL (ref 0.61–1.24)
GFR, Estimated: >60 mL/min (ref >60)
Glucose, Bld: 101 mg/dL — ABNORMAL HIGH (ref 70–99)
Potassium: 3.3 mmol/L — ABNORMAL LOW (ref 3.5–5.1)
Sodium: 138 mmol/L (ref 135–145)
Total Bilirubin: 0.6 mg/dL (ref 0.3–1.2)
Total Protein: 6.7 g/dL (ref 6.5–8.1)

## 2022-01-26 IMAGING — CT CT HEAD W/O CM
4 series · 15 of 47 positions shown, 17 images · non-contrast
Comparison: [DATE]

CLINICAL DATA: Neuro deficit, acute, stroke suspected



[Series 2: head wo · axial · 0.47mm/px · z∈[-126,-16]mm · 7 of 30 slices shown, 9 images]
[im 4/30  brain]
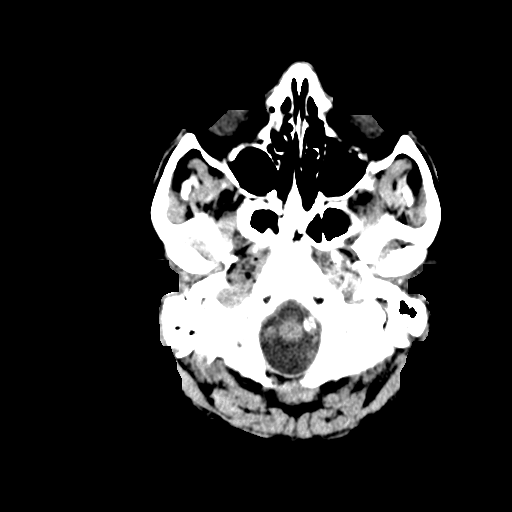
[im 4/30  bone]
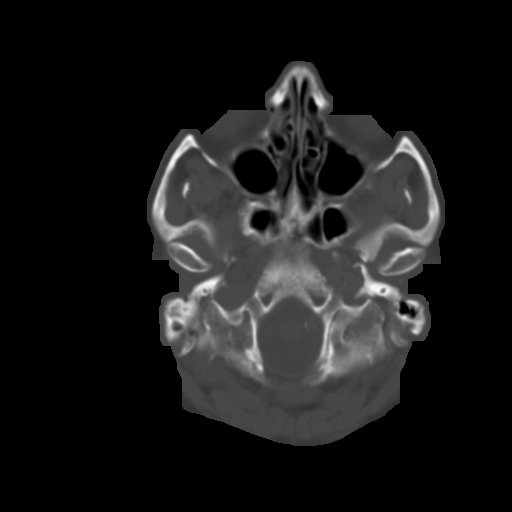
[im 8/30  brain]
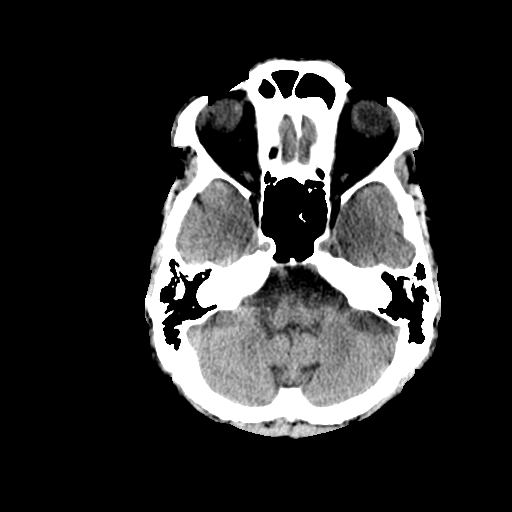
[im 11/30  brain]
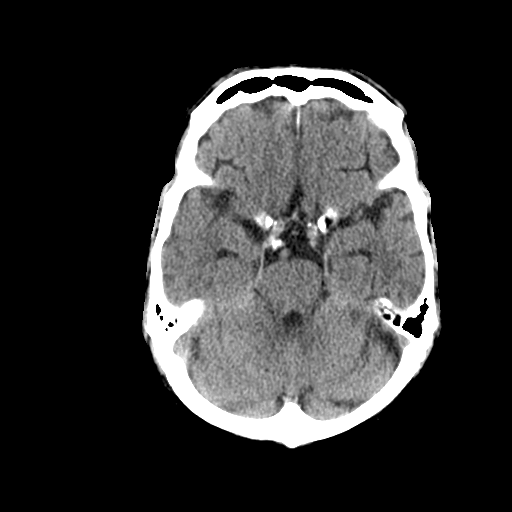
[im 15/30  brain]
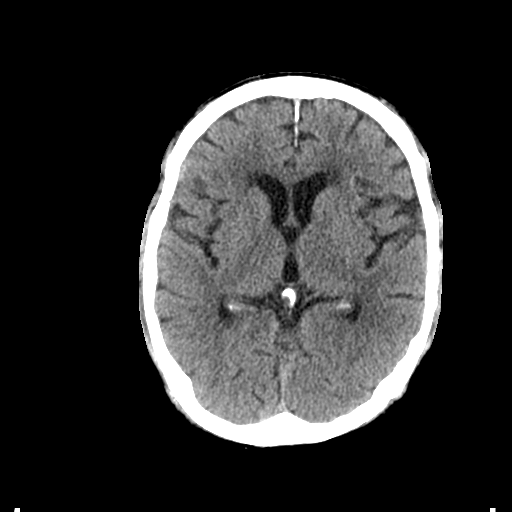
[im 19/30  brain]
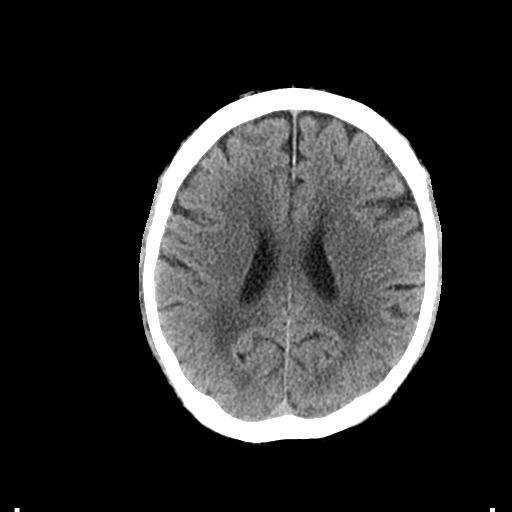
[im 19/30  bone]
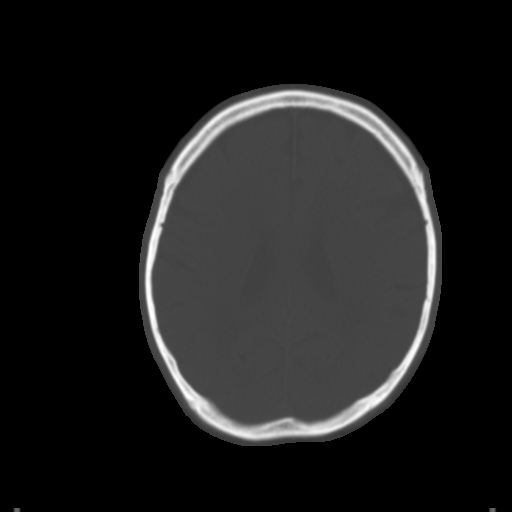
[im 22/30  brain]
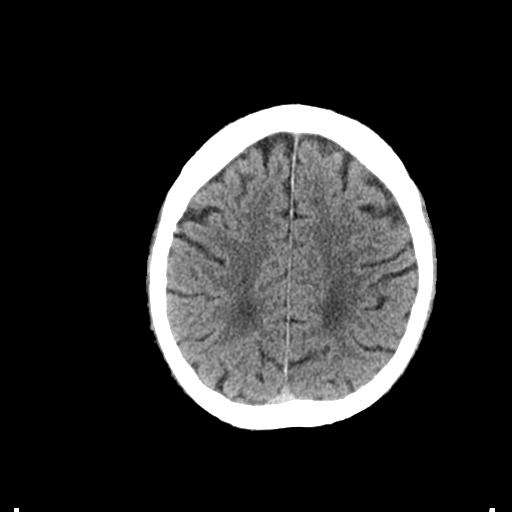
[im 26/30  brain]
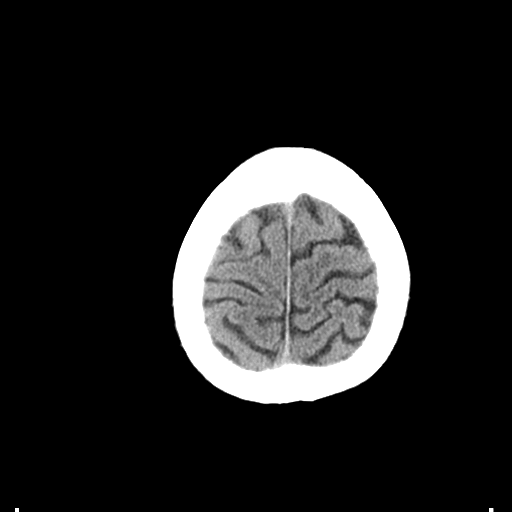

[Series 3: head bone · axial · 0.47mm/px · z∈[-127,-113]mm · 2 of 75 slices shown]
[im 8/75  bone]
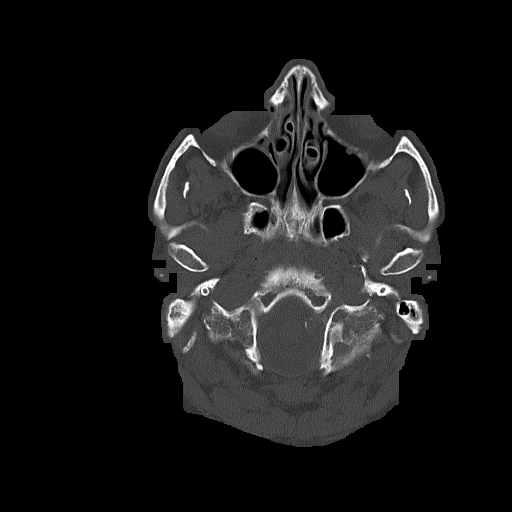
[im 15/75  bone]
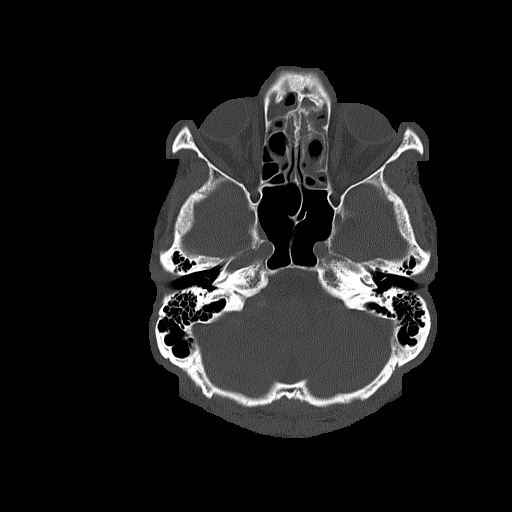

[Series 5: coronal soft tissue · coronal · 0.32mm/px · 3 of 77 slices shown]
[im 26/77  brain]
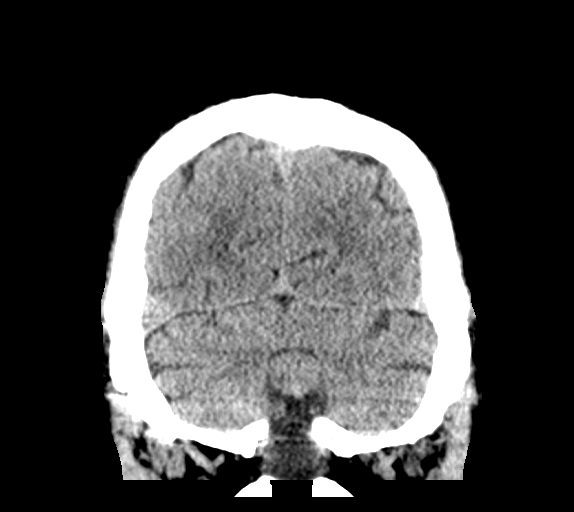
[im 34/77  brain]
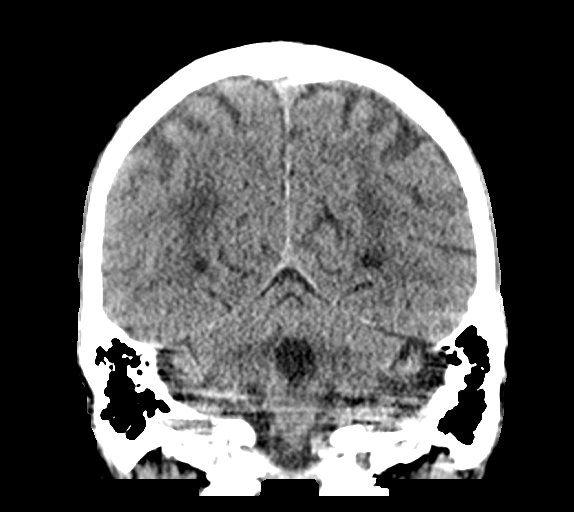
[im 43/77  brain]
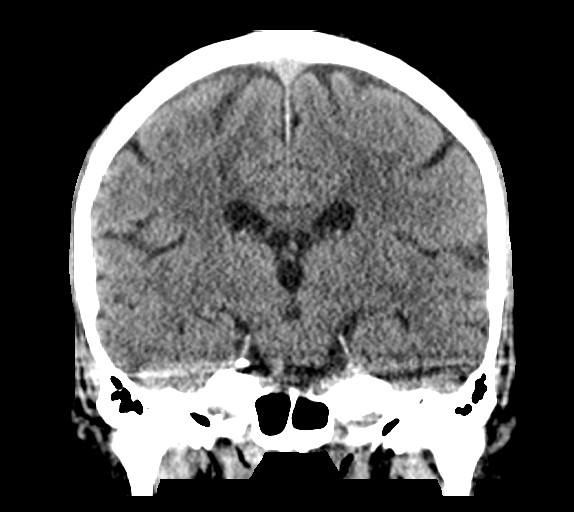

[Series 6: sagittal soft tissue · sagittal · 0.32mm/px · 3 of 62 slices shown]
[im 21/62  brain]
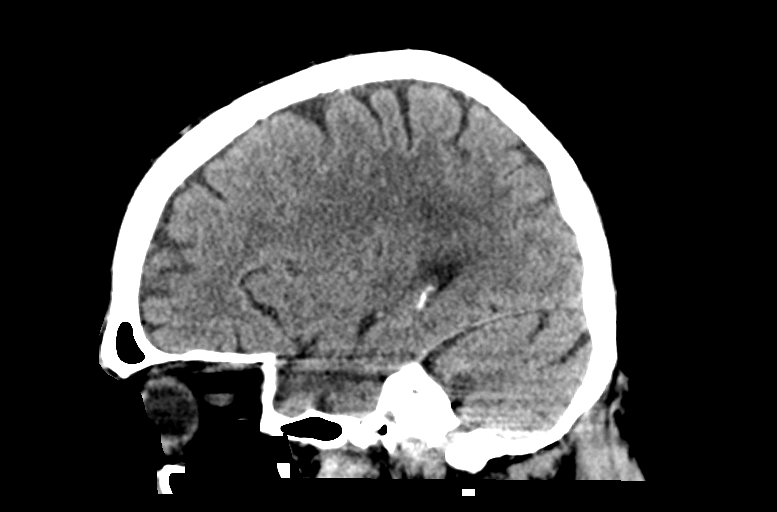
[im 31/62  brain]
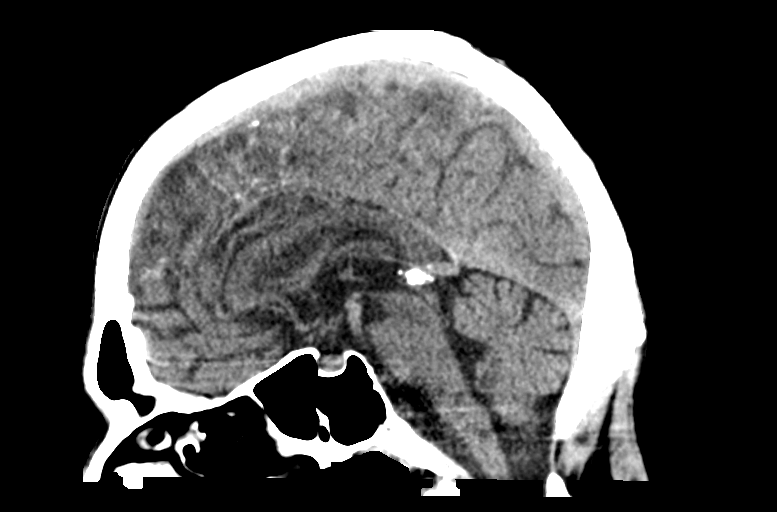
[im 41/62  brain]
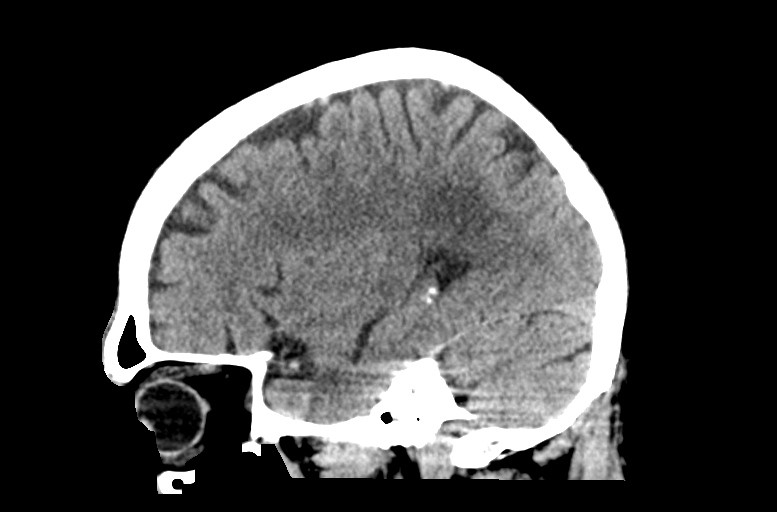

[15 of 47 positions shown; findings below may reference images not displayed]

FINDINGS: Brain: No evidence of acute infarction, hemorrhage, hydrocephalus,
extra-axial collection or mass lesion/mass effect. Mild-moderate
low-density changes within the periventricular and subcortical white
matter compatible with chronic microvascular ischemic change. Mild
diffuse cerebral volume loss.

Vascular: Atherosclerotic calcifications involving the large vessels
of the skull base. No unexpected hyperdense vessel.

Skull: Normal. Negative for fracture or focal lesion.

Sinuses/Orbits: Mucosal thickening throughout the bilateral ethmoid
air cells.

Other: None.
IMPRESSION: 1. No acute intracranial abnormality.
2. Chronic microvascular ischemic change and cerebral volume loss.

## 2022-01-26 IMAGING — MR MR MRA NECK WO/W CM
5 of 8 series · 31 of 48 positions shown · IV contrast ([ID] GADAVIST)
Comparison: MRI head [YX]

CLINICAL DATA: Neuro deficit, acute, stroke suspected

EXAM:
MRI HEAD WITHOUT CONTRAST
MRA HEAD WITHOUT CONTRAST
MRA NECK WITHOUT AND WITH CONTRAST
TECHNIQUE: Multiplanar, multi-echo pulse sequences of the brain and surrounding
structures were acquired without intravenous contrast. Angiographic
images of the Circle of Willis were acquired using MRA technique
without intravenous contrast. Angiographic images of the neck were
acquired using MRA technique without and with intravenous contrast.
Carotid stenosis measurements (when applicable) are obtained
utilizing NASCET criteria, using the distal internal carotid
diameter as the denominator.
CONTRAST:  7.5mL GADAVIST GADOBUTROL 1 MMOL/ML IV SOLN

[Series 4: tof_2d_tra include arch · axial · 3.5mm · 0.43mm/px · z∈[-270,-3]mm · 7 of 110 slices shown]
[im 1/110]
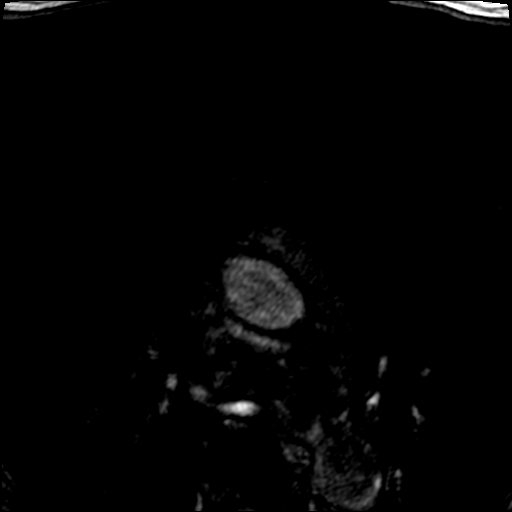
[im 19/110]
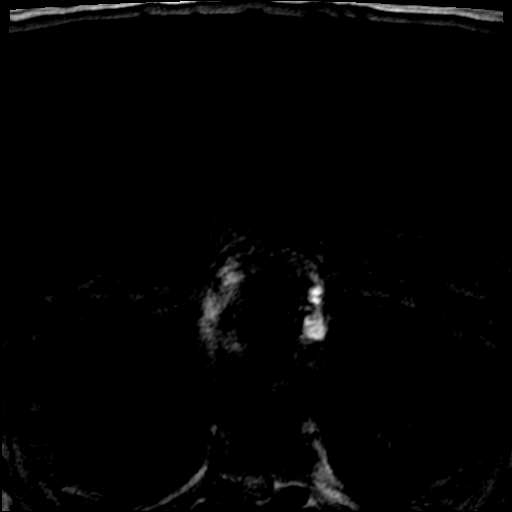
[im 37/110]
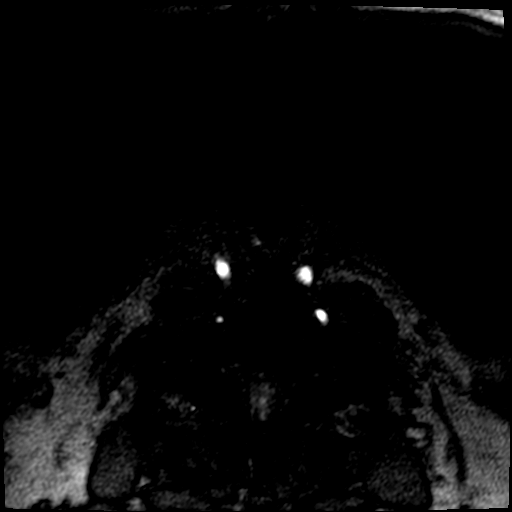
[im 55/110]
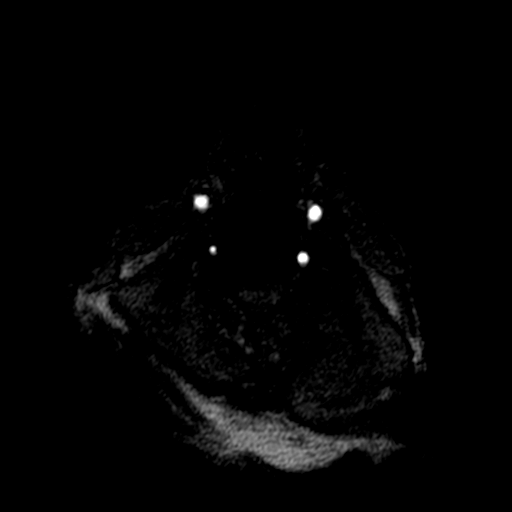
[im 73/110]
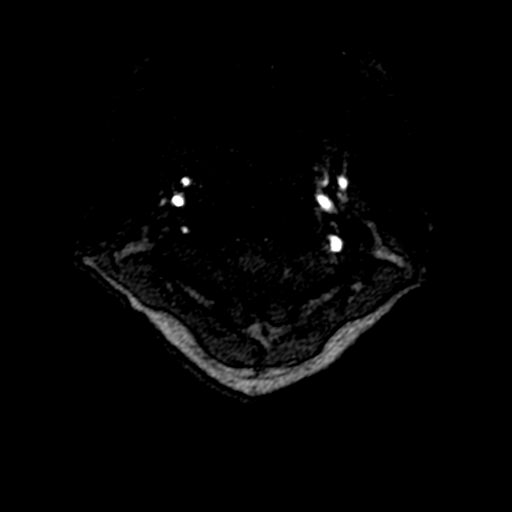
[im 91/110]
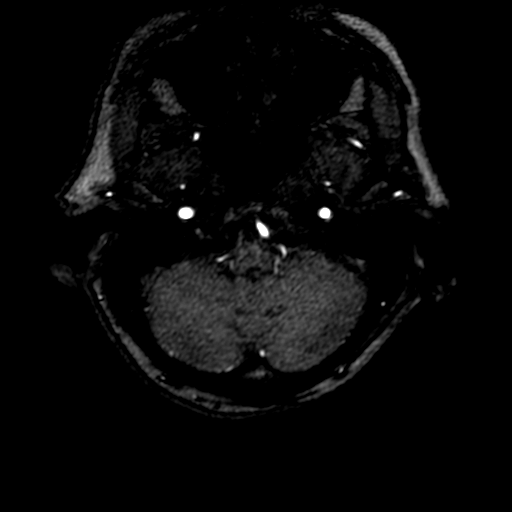
[im 110/110]
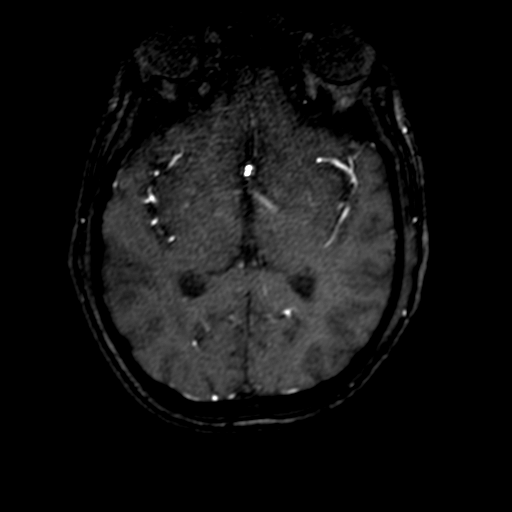

[Series 8: (id)_cor_pre · coronal · 0.8mm · 0.78mm/px · 7 of 112 slices shown]
[im 1/112]
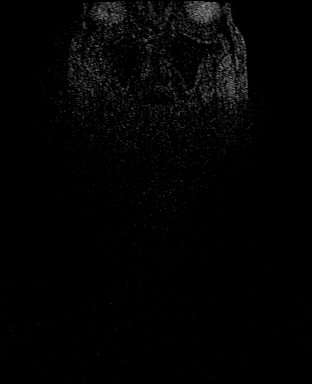
[im 19/112]
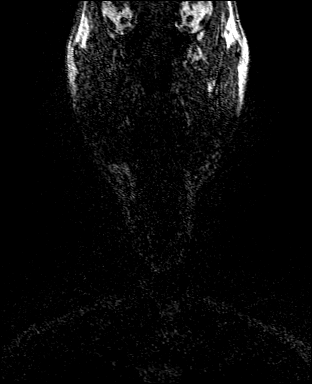
[im 38/112]
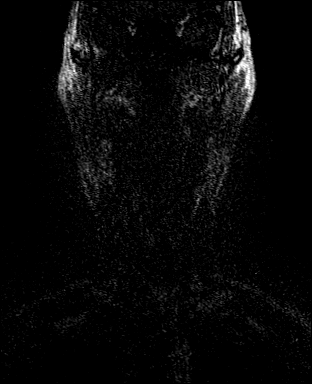
[im 56/112]
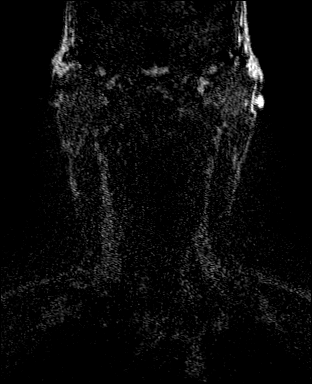
[im 75/112]
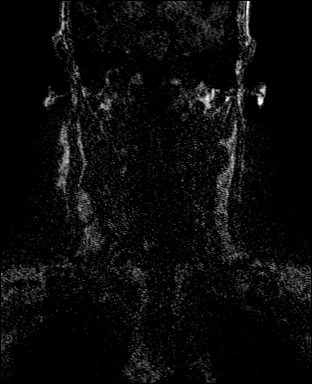
[im 93/112]
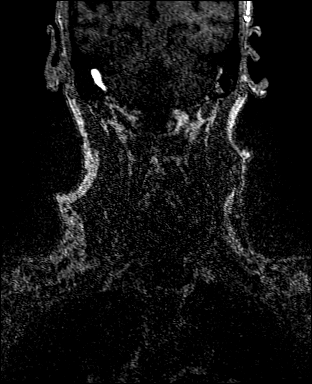
[im 112/112]
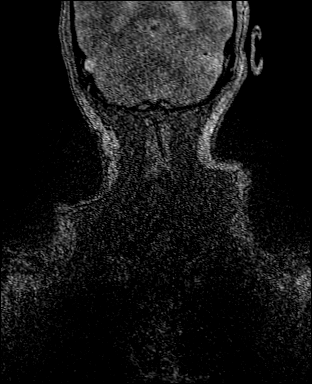

[Series 10: (id)_cor_post · coronal · 0.8mm · 0.78mm/px · 8 of 112 slices shown]
[im 1/112]
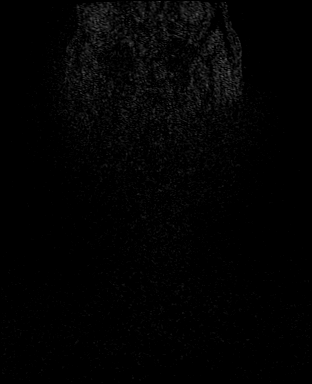
[im 16/112]
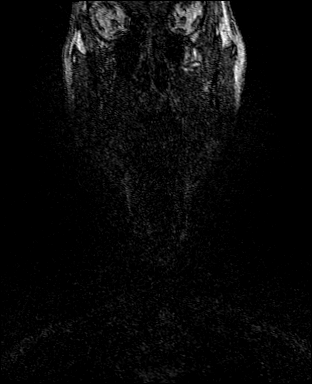
[im 32/112]
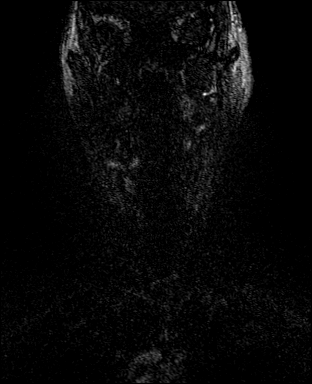
[im 48/112]
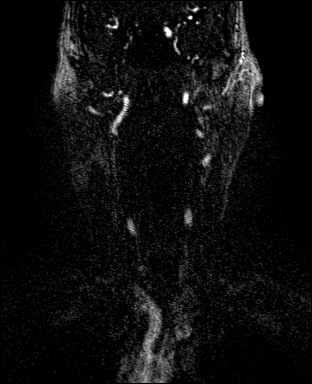
[im 64/112]
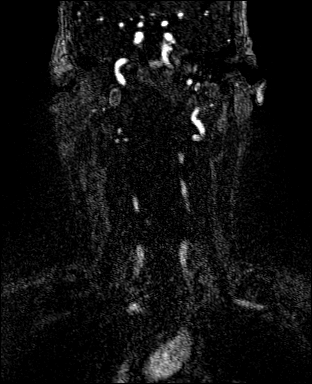
[im 80/112]
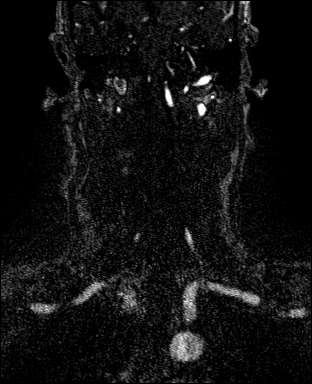
[im 96/112]
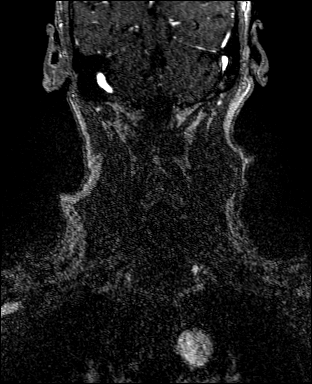
[im 112/112]
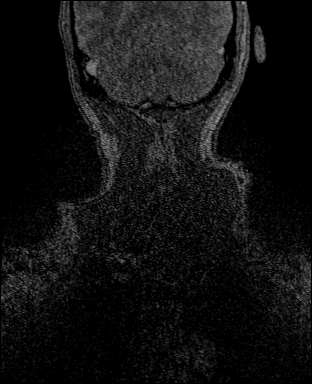

[Series 11: (id)_cor_post_sub · coronal · 0.8mm · 0.78mm/px · 8 of 112 slices shown]
[im 1/112]
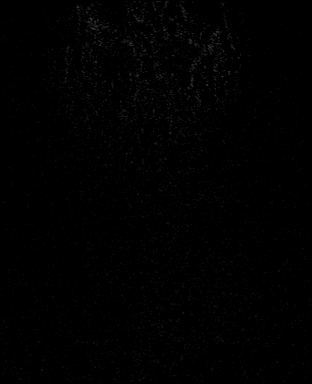
[im 16/112]
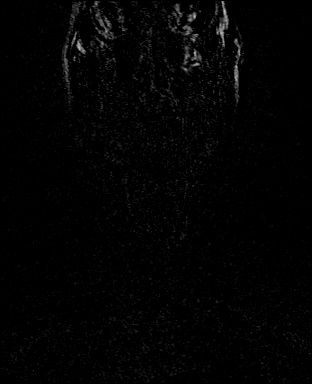
[im 32/112]
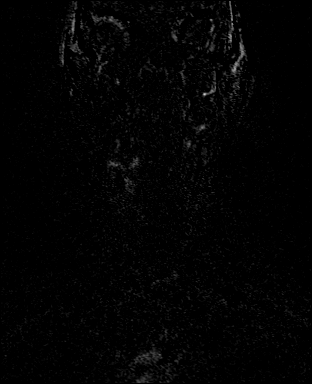
[im 48/112]
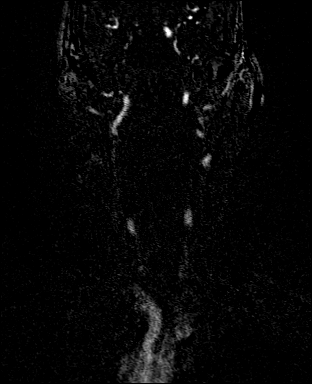
[im 64/112]
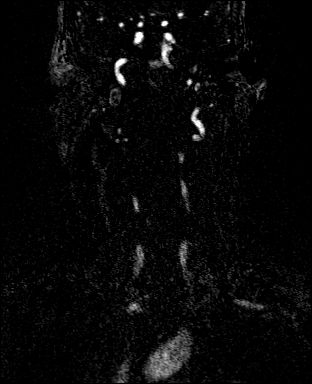
[im 80/112]
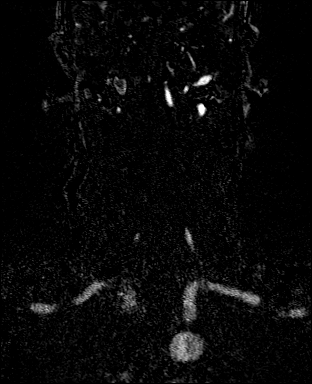
[im 96/112]
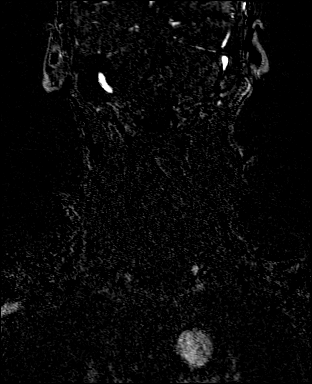
[im 112/112]
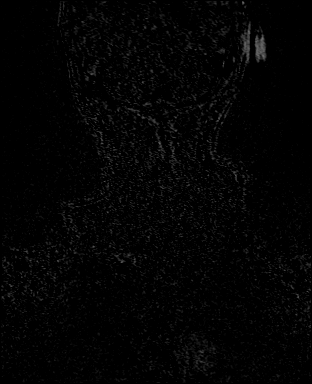

[Series 13: (id)_cor_post_venous · coronal · 0.8mm · 0.78mm/px · 1 of 112 slices shown]
[im 1/112]
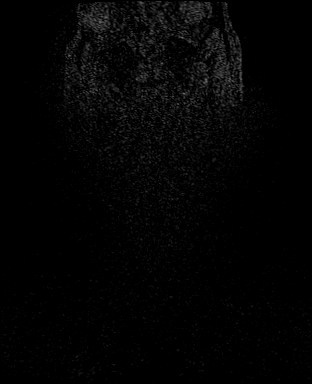

[31 of 48 positions shown; findings below may reference images not displayed]

FINDINGS: MRI HEAD

Brain: There is no acute infarction or intracranial hemorrhage.
There is no intracranial mass, mass effect, or edema. There is no
hydrocephalus or extra-axial fluid collection. Ventricles and sulci
are within normal limits in size and configuration. Patchy and
confluent areas of T2 hyperintensity in the supratentorial white
matter are nonspecific but may reflect mild to moderate chronic
microvascular ischemic changes.

Vascular: Major vessel flow voids at the skull base are preserved.

Skull and upper cervical spine: Normal marrow signal is preserved.

Sinuses/Orbits: Paranasal sinus mucosal thickening. Orbits are
unremarkable.

Other: Sella is unremarkable.  Mastoid air cells are clear.

MRA HEAD

Intracranial internal carotid arteries are patent. Middle and
anterior cerebral arteries are patent. Intracranial vertebral
arteries, basilar artery, posterior cerebral arteries are patent.
There is no significant stenosis or aneurysm.

MRA NECK

Motion artifact is present. Great vessel origins are patent. Common,
internal, and external carotid arteries are patent. There is likely
plaque at the ICA origins without hemodynamically significant
stenosis. Extracranial vertebral arteries are patent. Left vertebral
is dominant.
IMPRESSION: No acute infarction, hemorrhage, or mass. Mild to moderate chronic
microvascular ischemic changes.

No large vessel occlusion, hemodynamically significant stenosis, or
evidence of dissection.

## 2022-01-26 IMAGING — MR MR MRA HEAD W/O CM
1 series · 19 of 48 positions shown · IV contrast (gadavist)
Comparison: MRI head [YX]

CLINICAL DATA: Neuro deficit, acute, stroke suspected

EXAM:
MRI HEAD WITHOUT CONTRAST
MRA HEAD WITHOUT CONTRAST
MRA NECK WITHOUT AND WITH CONTRAST
TECHNIQUE: Multiplanar, multi-echo pulse sequences of the brain and surrounding
structures were acquired without intravenous contrast. Angiographic
images of the Circle of Willis were acquired using MRA technique
without intravenous contrast. Angiographic images of the neck were
acquired using MRA technique without and with intravenous contrast.
Carotid stenosis measurements (when applicable) are obtained
utilizing NASCET criteria, using the distal internal carotid
diameter as the denominator.
CONTRAST:  7.5mL GADAVIST GADOBUTROL 1 MMOL/ML IV SOLN

[Series 12: TOF · axial · 0.6mm · 0.35mm/px · z∈[-74,+24]mm · 19 of 172 slices shown]
[im 1/172]
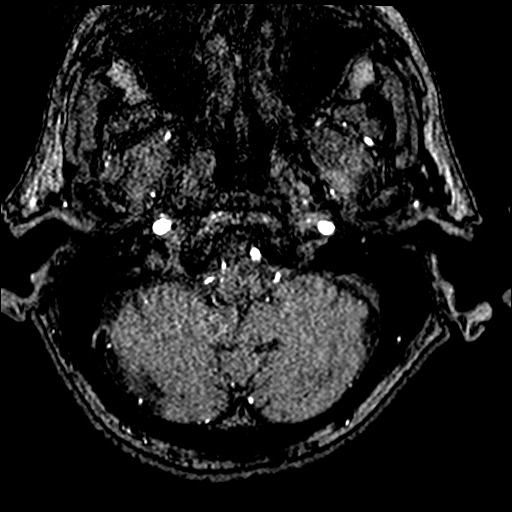
[im 4/172]
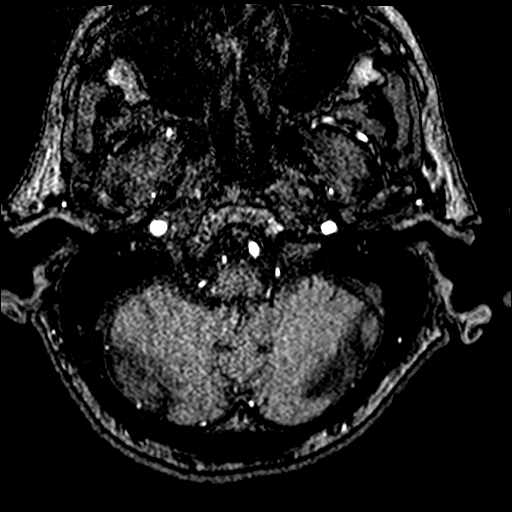
[im 8/172]
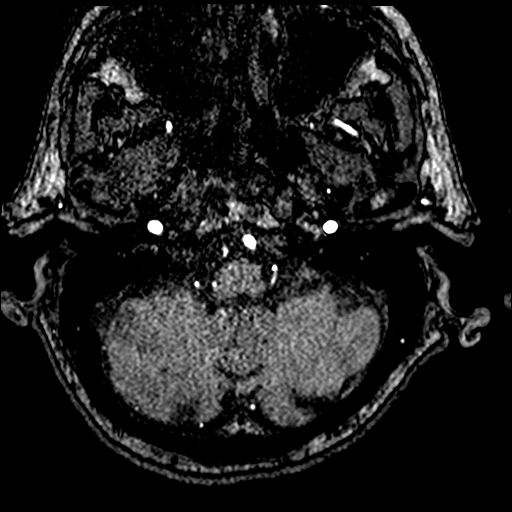
[im 11/172]
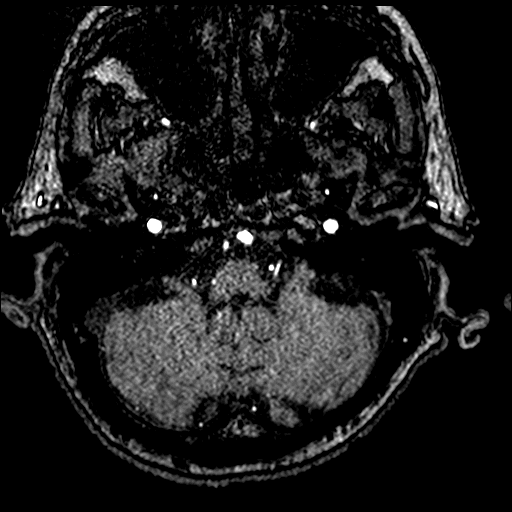
[im 15/172]
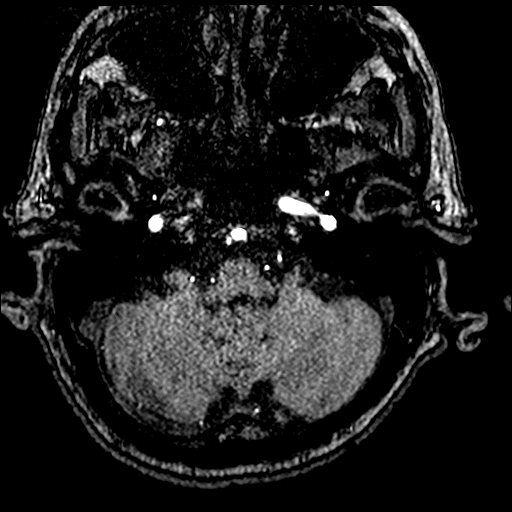
[im 19/172]
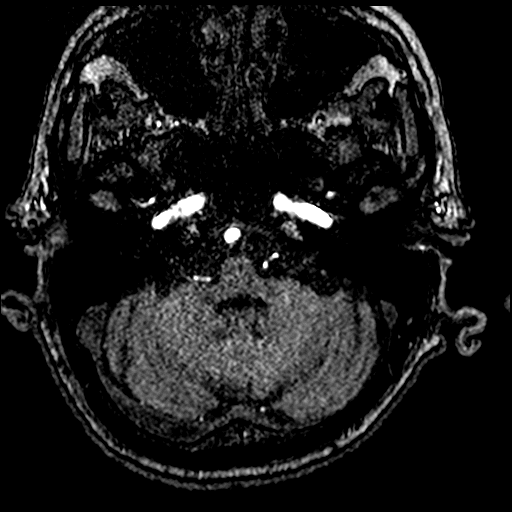
[im 22/172]
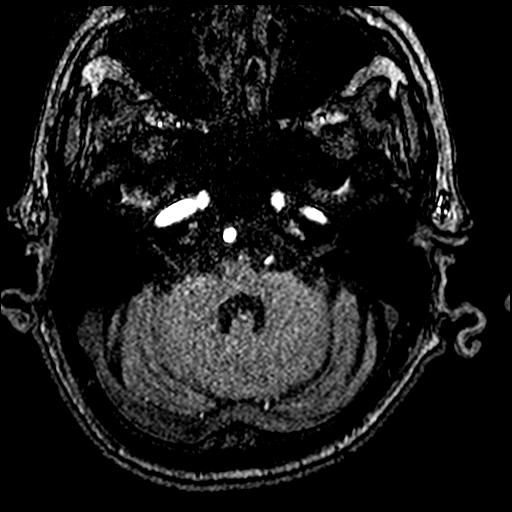
[im 26/172]
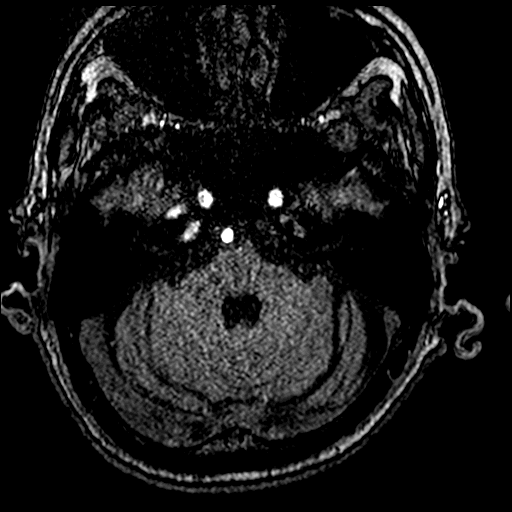
[im 30/172]
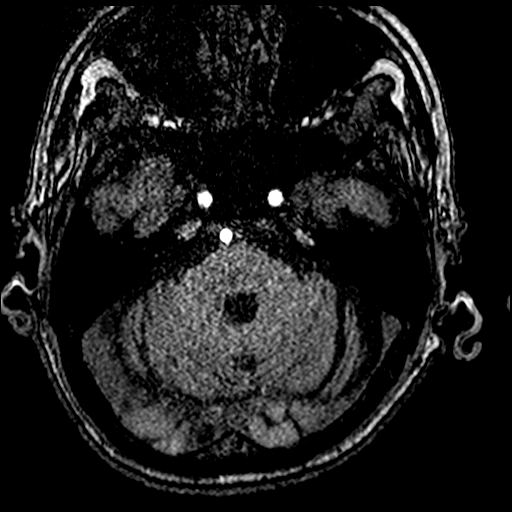
[im 33/172]
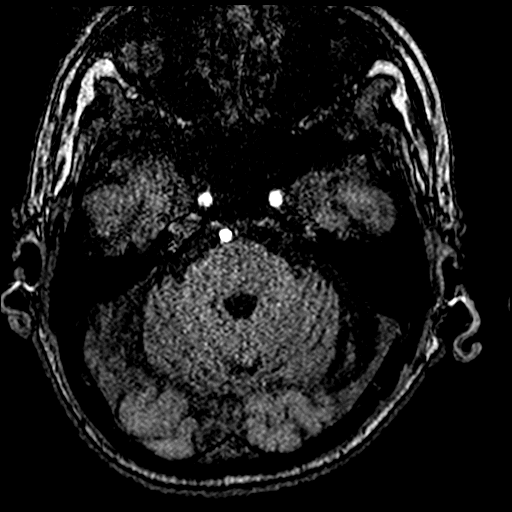
[im 37/172]
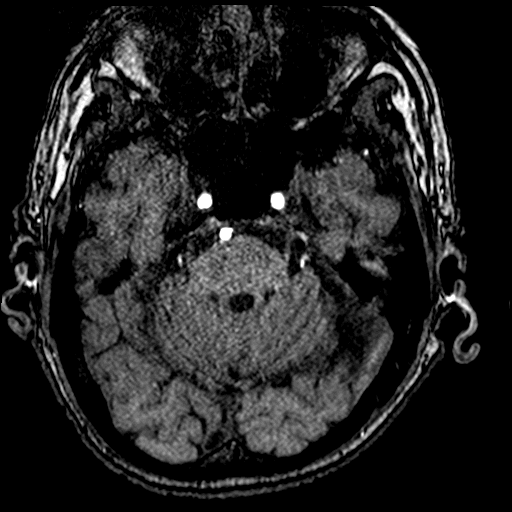
[im 55/172]
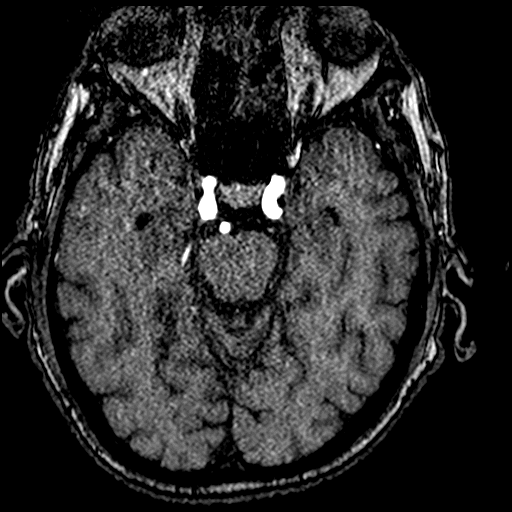
[im 77/172]
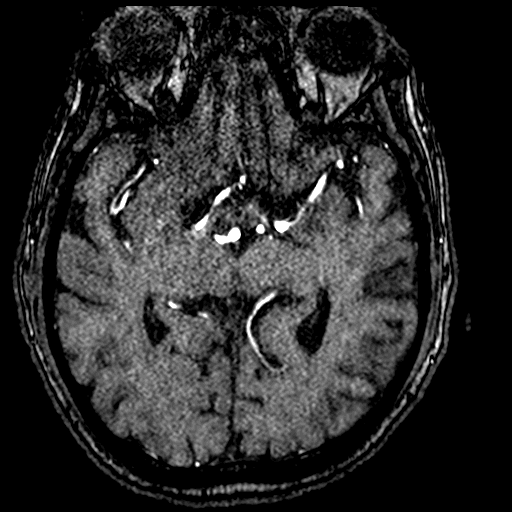
[im 88/172]
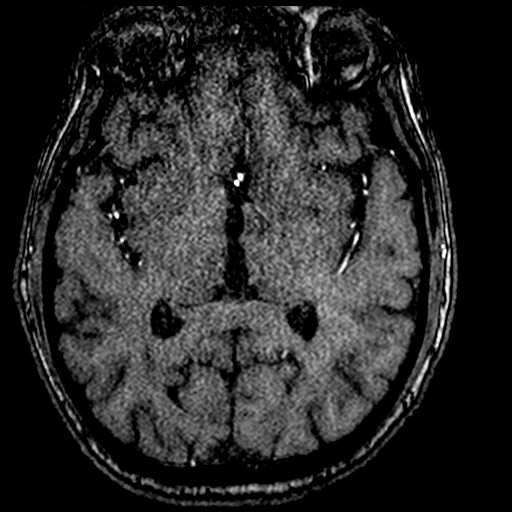
[im 99/172]
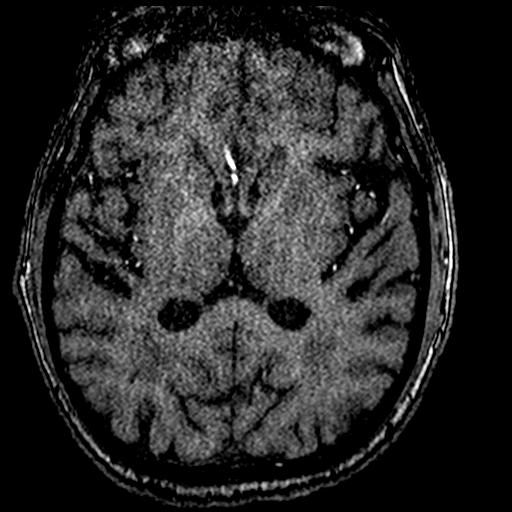
[im 121/172]
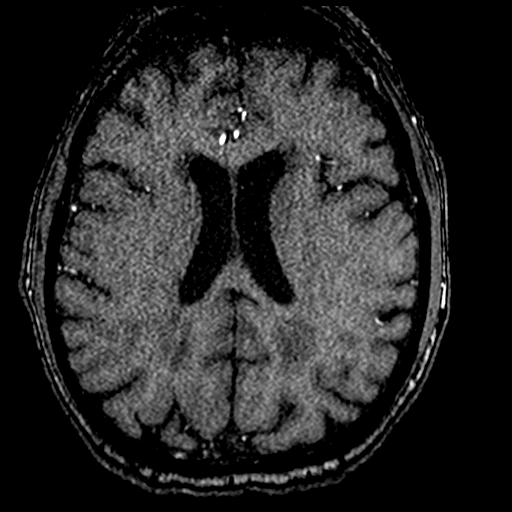
[im 142/172]
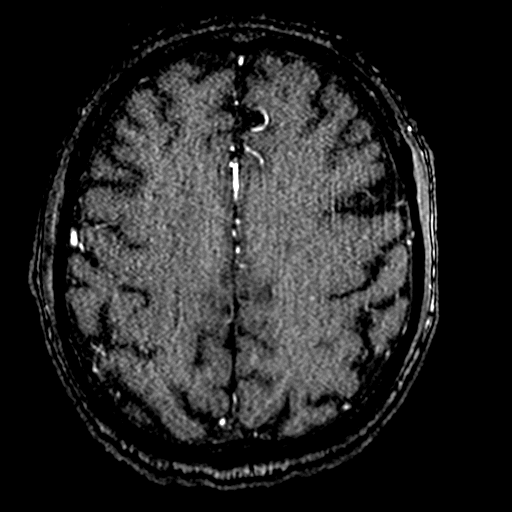
[im 146/172]
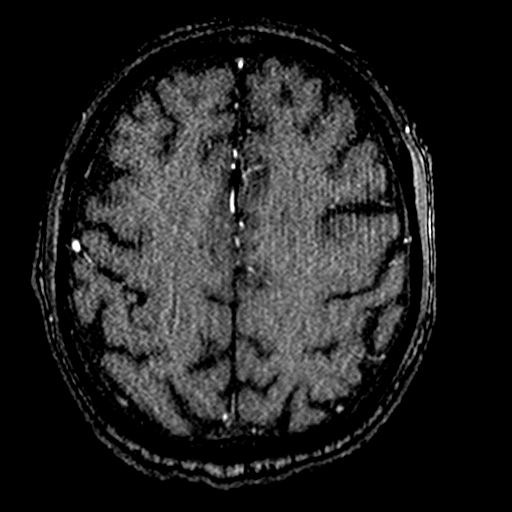
[im 164/172]
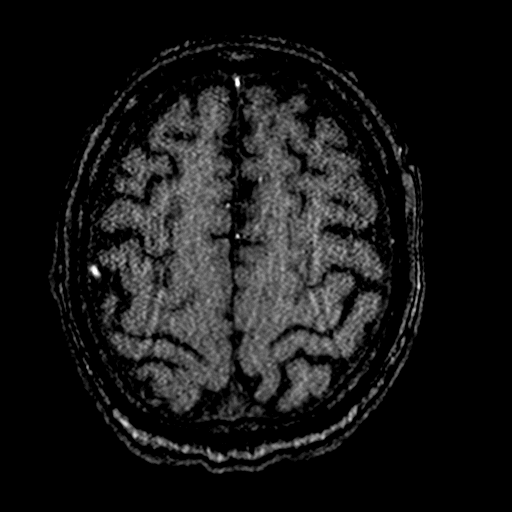

[19 of 48 positions shown; findings below may reference images not displayed]

FINDINGS: MRI HEAD

Brain: There is no acute infarction or intracranial hemorrhage.
There is no intracranial mass, mass effect, or edema. There is no
hydrocephalus or extra-axial fluid collection. Ventricles and sulci
are within normal limits in size and configuration. Patchy and
confluent areas of T2 hyperintensity in the supratentorial white
matter are nonspecific but may reflect mild to moderate chronic
microvascular ischemic changes.

Vascular: Major vessel flow voids at the skull base are preserved.

Skull and upper cervical spine: Normal marrow signal is preserved.

Sinuses/Orbits: Paranasal sinus mucosal thickening. Orbits are
unremarkable.

Other: Sella is unremarkable.  Mastoid air cells are clear.

MRA HEAD

Intracranial internal carotid arteries are patent. Middle and
anterior cerebral arteries are patent. Intracranial vertebral
arteries, basilar artery, posterior cerebral arteries are patent.
There is no significant stenosis or aneurysm.

MRA NECK

Motion artifact is present. Great vessel origins are patent. Common,
internal, and external carotid arteries are patent. There is likely
plaque at the ICA origins without hemodynamically significant
stenosis. Extracranial vertebral arteries are patent. Left vertebral
is dominant.
IMPRESSION: No acute infarction, hemorrhage, or mass. Mild to moderate chronic
microvascular ischemic changes.

No large vessel occlusion, hemodynamically significant stenosis, or
evidence of dissection.

## 2022-01-26 IMAGING — MR MR HEAD W/O CM
10 series · 48 of 48 positions shown · IV contrast (gadavist)
Comparison: MRI head [YX]

CLINICAL DATA: Neuro deficit, acute, stroke suspected

EXAM:
MRI HEAD WITHOUT CONTRAST
MRA HEAD WITHOUT CONTRAST
MRA NECK WITHOUT AND WITH CONTRAST
TECHNIQUE: Multiplanar, multi-echo pulse sequences of the brain and surrounding
structures were acquired without intravenous contrast. Angiographic
images of the Circle of Willis were acquired using MRA technique
without intravenous contrast. Angiographic images of the neck were
acquired using MRA technique without and with intravenous contrast.
Carotid stenosis measurements (when applicable) are obtained
utilizing NASCET criteria, using the distal internal carotid
diameter as the denominator.
CONTRAST:  7.5mL GADAVIST GADOBUTROL 1 MMOL/ML IV SOLN

[Series 9: DWI · axial · 3.0mm · 1.36mm/px · z∈[-75,+64]mm · 8 of 96 slices shown (1 of 2)]
[im 1/96]
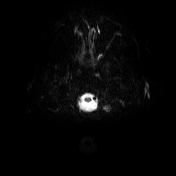
[im 14/96]
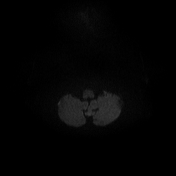
[im 28/96]
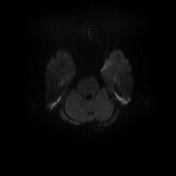
[im 41/96]
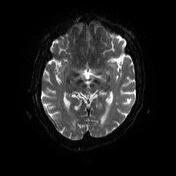
[im 55/96]
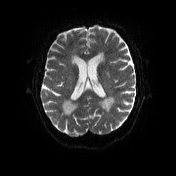
[im 68/96]
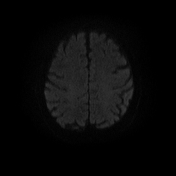
[im 82/96]
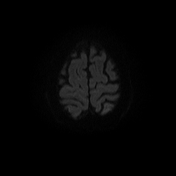
[im 96/96]
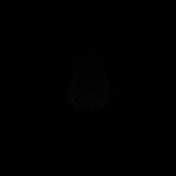

[Series 10: DWI · axial · 3.0mm · 1.36mm/px · z∈[-75,+64]mm · 4 of 48 slices shown (2 of 2)]
[im 1/48]
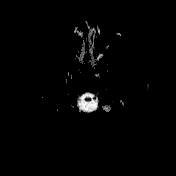
[im 16/48]
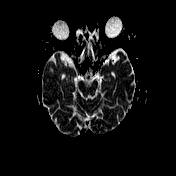
[im 32/48]
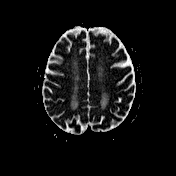
[im 48/48]
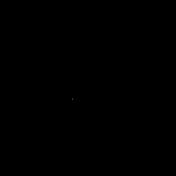

[Series 11: T1 · sagittal · 5.0mm · 0.75mm/px · 2 of 24 slices shown (1 of 2)]
[im 1/24]
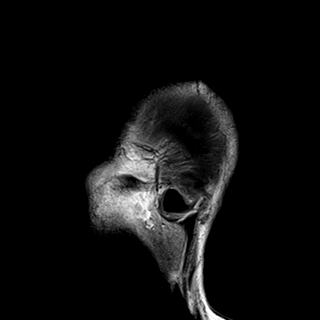
[im 24/24]
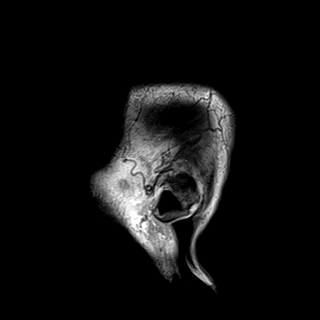

[Series 12: T2 · axial · 5.0mm · 0.62mm/px · z∈[-80,+68]mm · 2 of 24 slices shown (1 of 2)]
[im 1/24]
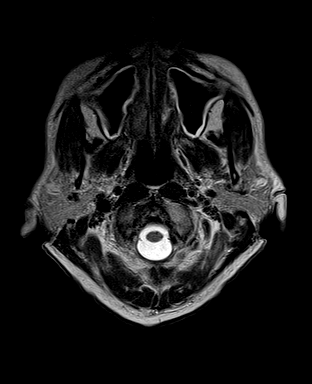
[im 24/24]
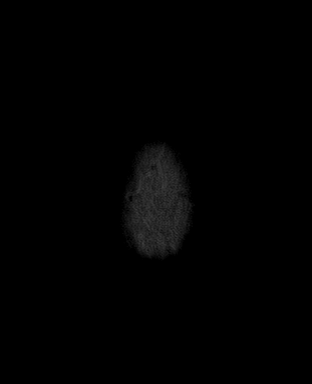

[Series 13: swi_images · axial · 3.0mm · 0.75mm/px · z∈[-111,+100]mm · 6 of 72 slices shown]
[im 1/72]
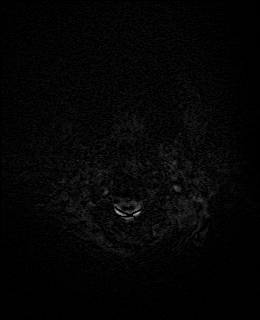
[im 15/72]
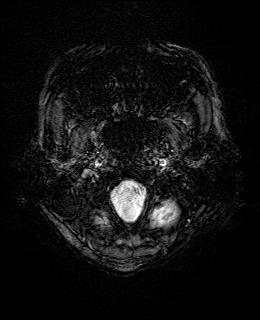
[im 29/72]
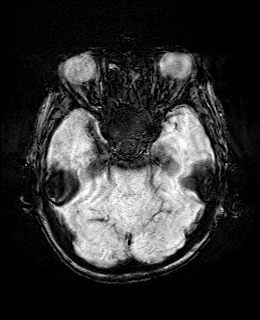
[im 43/72]
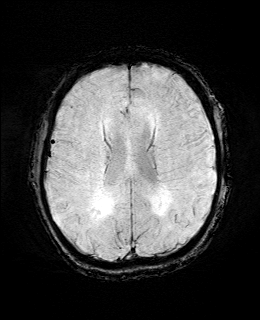
[im 57/72]
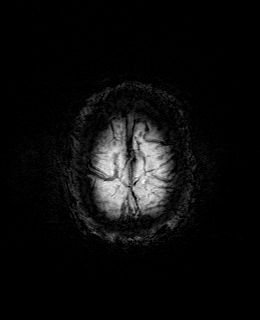
[im 72/72]
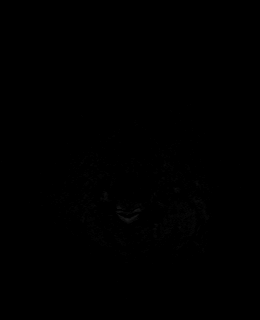

[Series 15: FLAIR · axial · 3.0mm · 0.75mm/px · z∈[-81,+70]mm · 4 of 52 slices shown]
[im 1/52]
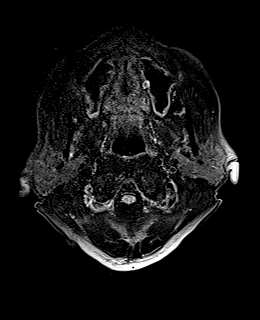
[im 18/52]
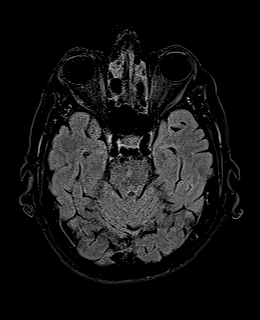
[im 35/52]
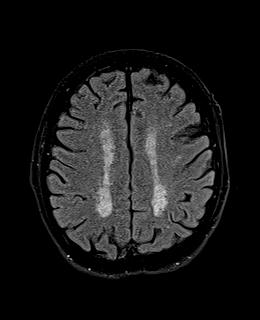
[im 52/52]
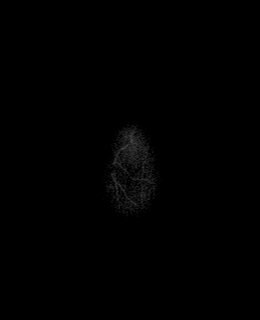

[Series 16: T1 · axial · 1.0mm · 0.94mm/px · z∈[-76,+66]mm · 12 of 144 slices shown (2 of 2)]
[im 1/144]
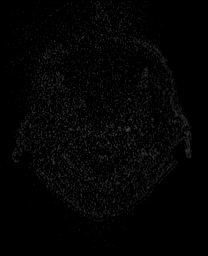
[im 14/144]
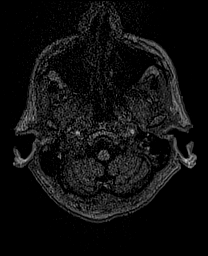
[im 27/144]
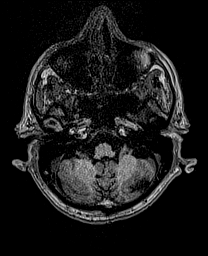
[im 40/144]
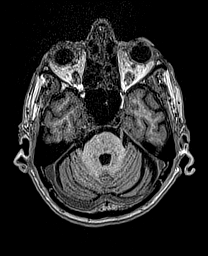
[im 53/144]
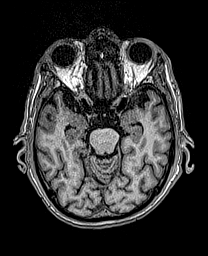
[im 66/144]
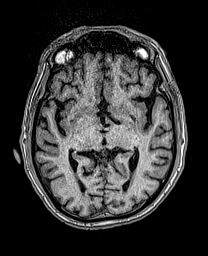
[im 79/144]
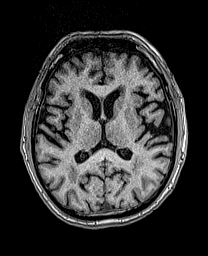
[im 92/144]
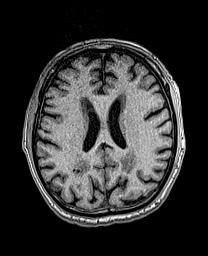
[im 105/144]
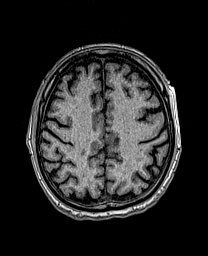
[im 118/144]
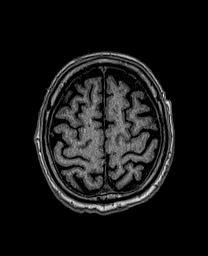
[im 131/144]
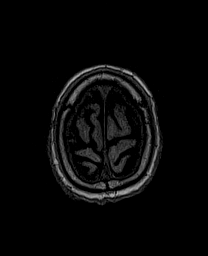
[im 144/144]
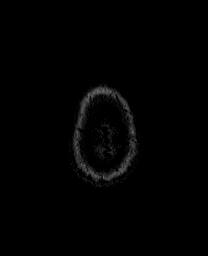

[Series 17: cor dwi_tracew · coronal · 5.0mm · 1.53mm/px · 5 of 56 slices shown]
[im 1/56]
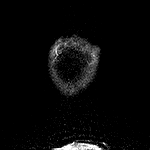
[im 14/56]
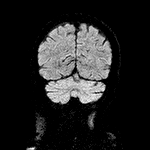
[im 28/56]
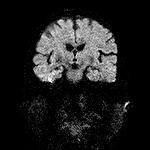
[im 42/56]
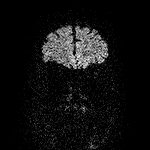
[im 56/56]
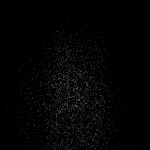

[Series 18: cor dwi_adc · coronal · 5.0mm · 1.53mm/px · 2 of 26 slices shown]
[im 1/26]
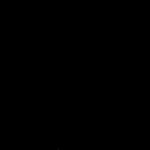
[im 26/26]
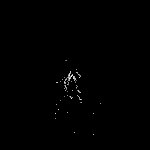

[Series 19: T2 · coronal · 5.0mm · 0.57mm/px · 3 of 34 slices shown (2 of 2)]
[im 1/34]
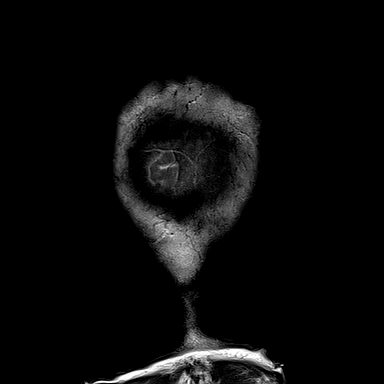
[im 17/34]
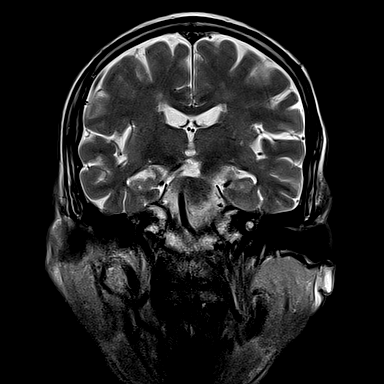
[im 34/34]
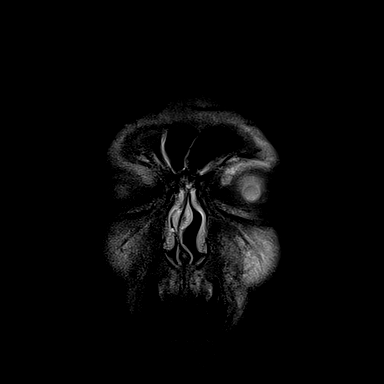

[48 of 48 positions shown; findings below may reference images not displayed]

FINDINGS: MRI HEAD

Brain: There is no acute infarction or intracranial hemorrhage.
There is no intracranial mass, mass effect, or edema. There is no
hydrocephalus or extra-axial fluid collection. Ventricles and sulci
are within normal limits in size and configuration. Patchy and
confluent areas of T2 hyperintensity in the supratentorial white
matter are nonspecific but may reflect mild to moderate chronic
microvascular ischemic changes.

Vascular: Major vessel flow voids at the skull base are preserved.

Skull and upper cervical spine: Normal marrow signal is preserved.

Sinuses/Orbits: Paranasal sinus mucosal thickening. Orbits are
unremarkable.

Other: Sella is unremarkable.  Mastoid air cells are clear.

MRA HEAD

Intracranial internal carotid arteries are patent. Middle and
anterior cerebral arteries are patent. Intracranial vertebral
arteries, basilar artery, posterior cerebral arteries are patent.
There is no significant stenosis or aneurysm.

MRA NECK

Motion artifact is present. Great vessel origins are patent. Common,
internal, and external carotid arteries are patent. There is likely
plaque at the ICA origins without hemodynamically significant
stenosis. Extracranial vertebral arteries are patent. Left vertebral
is dominant.
IMPRESSION: No acute infarction, hemorrhage, or mass. Mild to moderate chronic
microvascular ischemic changes.

No large vessel occlusion, hemodynamically significant stenosis, or
evidence of dissection.

## 2022-01-26 MED ORDER — GADOBUTROL 1 MMOL/ML IV SOLN
7.5000 mL | Freq: Once | INTRAVENOUS | Status: AC | PRN
  Administered 2022-01-26: 7.5 mL via INTRAVENOUS

## 2022-01-26 NOTE — ED Triage Notes (Signed)
Pt had a sudden onset yesterday, difficulty walking. Walking with crutches to get to triage. No other symptoms reported.

## 2022-01-26 NOTE — ED Notes (Signed)
Patient remains in MRI 

## 2022-01-26 NOTE — ED Provider Notes (Signed)
[facility] DEPT Provider Note   CSN: [id number] Arrival date & time: 01/26/22  1228     History  No chief complaint on file.   Mitchell Harris is a 81 y.o. male with a past medical history of hypertension, arthritis, osteoarthritis.  Presents emergency department with weakness to bilateral lower extremities and difficulty walking.  Patient reports that symptoms have been present over the last 3 days and have gotten gradually worse over this time.  Patient states "it just does not feel like my legs want to work."  Patient states that on the first day he was able to ambulate however felt off balance.  Patient states that the second day he was having difficulty walking due to his weakness.  Patient states that today he has had to use crutches to ambulate.  Patient denies any similar episodes of weakness.  Denies any recent falls or injuries.  Patient reports that he was recently started on gabapentin and a sleep medication however his difficulty walking started before taking these medications.  Denies any fevers, chills, numbness, facial asymmetry, dysarthria, visual disturbance, headache, neck pain, back pain, saddle anesthesia, bowel/bladder dysfunction.  Patient denies any history of cancer, IV drug use.  HPI     Home Medications Prior to Admission medications   Medication Sig Start Date End Date Taking? Authorizing Provider  amLODipine (NORVASC) 10 MG tablet TAKE 1 TABLET BY MOUTH DAILY 06/30/19   Miquel Dunn, NP  atorvastatin (LIPITOR) 10 MG tablet Take 10 mg by mouth daily at 6 PM.     [provider]  celecoxib (CELEBREX) 100 MG capsule Take 100 mg by mouth daily.    [provider]  HYDROmorphone (DILAUDID) 2 MG tablet Take 1 tablet (2 mg total) by mouth every 4 (four) hours as needed for severe pain. Patient not taking: Reported on 04/07/2019 11/03/15   Marchia Bond, MD  losartan (COZAAR) 100 MG tablet Take 100 mg by mouth  daily after supper.     [provider]  meloxicam (MOBIC) 15 MG tablet Take 1 tablet (15 mg total) by mouth daily. Patient not taking: Reported on 04/07/2019 03/28/17   Landis Martins, DPM  methocarbamol (ROBAXIN) 750 MG tablet Take 750 mg by mouth 4 (four) times daily.    [provider]  ondansetron (ZOFRAN) 4 MG tablet Take 1 tablet (4 mg total) by mouth every 8 (eight) hours as needed for nausea or vomiting. Patient not taking: Reported on 04/07/2019 11/03/15   Marchia Bond, MD  oxyCODONE-acetaminophen (PERCOCET) 10-325 MG tablet Take 1-2 tablets by mouth every 6 (six) hours as needed for pain. MAXIMUM TOTAL ACETAMINOPHEN DOSE IS 4000 MG PER DAY 11/03/15   Marchia Bond, MD  sennosides-docusate sodium (SENOKOT-S) 8.6-50 MG tablet Take 2 tablets by mouth daily. Patient not taking: Reported on 04/07/2019 11/03/15   Marchia Bond, MD  tamsulosin (FLOMAX) 0.4 MG CAPS capsule Take 0.4 mg by mouth daily after supper.     [provider]  zolpidem (AMBIEN) 10 MG tablet Take 10 mg by mouth at bedtime.    [provider]      Allergies    Patient has no known allergies.    Review of Systems   Review of Systems  Constitutional:  Negative for chills and fever.  Eyes:  Negative for visual disturbance.  Respiratory:  Negative for shortness of breath.   Cardiovascular:  Negative for chest pain.  Gastrointestinal:  Negative for abdominal pain, nausea and vomiting.  Genitourinary:  Negative for difficulty urinating and dysuria.  Musculoskeletal:  Negative for back pain and neck pain.  Skin:  Negative for color change and rash.  Neurological:  Positive for weakness (bilateral lower legs). Negative for dizziness, tremors, seizures, syncope, facial asymmetry, speech difficulty, light-headedness, numbness and headaches.  Psychiatric/Behavioral:  Negative for confusion.    Physical Exam Updated Vital Signs BP (!) 173/108    Pulse 99    Resp 20    Ht 5\' 8"  (1.727 m)    Wt  74.8 kg    SpO2 100%    BMI 25.09 kg/m  Physical Exam Vitals and nursing note reviewed.  Constitutional:      General: He is not in acute distress.    Appearance: He is not ill-appearing, toxic-appearing or diaphoretic.  Eyes:     General: No scleral icterus.       Right eye: No discharge.        Left eye: No discharge.     Extraocular Movements: Extraocular movements intact.     Conjunctiva/sclera: Conjunctivae normal.     Pupils: Pupils are equal, round, and reactive to light.     Comments: Horizontal nystagmus noted to bilateral eyes  Cardiovascular:     Rate and Rhythm: Normal rate.  Pulmonary:     Effort: Pulmonary effort is normal. No tachypnea, bradypnea or respiratory distress.     Breath sounds: Normal breath sounds. No stridor.  Abdominal:     Palpations: Abdomen is soft.     Tenderness: There is no abdominal tenderness.  Musculoskeletal:     Cervical back: Normal range of motion and neck supple. No swelling, edema, deformity, erythema, signs of trauma, lacerations, rigidity, spasms, torticollis, tenderness, bony tenderness or crepitus. No pain with movement. Normal range of motion.     Thoracic back: No swelling, edema, deformity, signs of trauma, lacerations, spasms, tenderness or bony tenderness.     Lumbar back: No swelling, edema, deformity, signs of trauma, lacerations, spasms, tenderness or bony tenderness.  Skin:    General: Skin is warm and dry.  Neurological:     General: No focal deficit present.     Mental Status: He is alert.     GCS: GCS eye subscore is 4. GCS verbal subscore is 5. GCS motor subscore is 6.     Cranial Nerves: No cranial nerve deficit or facial asymmetry.     Sensory: Sensation is intact.     Motor: No weakness, tremor, seizure activity or pronator drift.     Coordination: Romberg sign negative. Finger-Nose-Finger Test normal.     Gait: Gait abnormal.     Deep Tendon Reflexes:     Reflex Scores:      Patellar reflexes are 1+ on the right  side and 2+ on the left side.      Achilles reflexes are 1+ on the right side and 2+ on the left side.    Comments: CN II-XII intact, equal grip strength, +5 strength to bilateral upper and lower extremities.  Patient has weakness to dorsiflexion and plantarflexion to left foot.  Sensation to light touch grossly intact to bilateral upper and lower extremities.  Patient noted to have a shuffled gait with ambulation.  Greater difficulty lifting left foot with ambulation.  Psychiatric:        Behavior: Behavior is cooperative.    ED Results / Procedures / Treatments   Labs (all labs ordered are listed, but only abnormal results are displayed) Labs Reviewed - No data  to display  EKG None  Radiology CT HEAD WO CONTRAST (5MM)  Result Date: 01/26/2022 CLINICAL DATA:  Neuro deficit, acute, stroke suspected EXAM: CT HEAD WITHOUT CONTRAST TECHNIQUE: Contiguous axial images were obtained from the base of the skull through the vertex without intravenous contrast. RADIATION DOSE REDUCTION: This exam was performed according to the departmental dose-optimization program which includes automated exposure control, adjustment of the mA and/or kV according to patient size and/or use of iterative reconstruction technique. COMPARISON:  04/09/2018 FINDINGS: Brain: No evidence of acute infarction, hemorrhage, hydrocephalus, extra-axial collection or mass lesion/mass effect. Mild-moderate low-density changes within the periventricular and subcortical white matter compatible with chronic microvascular ischemic change. Mild diffuse cerebral volume loss. Vascular: Atherosclerotic calcifications involving the large vessels of the skull base. No unexpected hyperdense vessel. Skull: Normal. Negative for fracture or focal lesion. Sinuses/Orbits: Mucosal thickening throughout the bilateral ethmoid air cells. Other: None. IMPRESSION: 1. No acute intracranial abnormality. 2. Chronic microvascular ischemic change and cerebral volume  loss. Electronically Signed   By: Davina Poke D.O.   On: 01/26/2022 13:28   MR ANGIO HEAD WO CONTRAST  Result Date: 01/26/2022 CLINICAL DATA:  Neuro deficit, acute, stroke suspected EXAM: MRI HEAD WITHOUT CONTRAST MRA HEAD WITHOUT CONTRAST MRA NECK WITHOUT AND WITH CONTRAST TECHNIQUE: Multiplanar, multi-echo pulse sequences of the brain and surrounding structures were acquired without intravenous contrast. Angiographic images of the Circle of Willis were acquired using MRA technique without intravenous contrast. Angiographic images of the neck were acquired using MRA technique without and with intravenous contrast. Carotid stenosis measurements (when applicable) are obtained utilizing NASCET criteria, using the distal internal carotid diameter as the denominator. CONTRAST:  7.59mL GADAVIST GADOBUTROL 1 MMOL/ML IV SOLN COMPARISON:  MRI head 2019 FINDINGS: MRI HEAD Brain: There is no acute infarction or intracranial hemorrhage. There is no intracranial mass, mass effect, or edema. There is no hydrocephalus or extra-axial fluid collection. Ventricles and sulci are within normal limits in size and configuration. Patchy and confluent areas of T2 hyperintensity in the supratentorial white matter are nonspecific but may reflect mild to moderate chronic microvascular ischemic changes. Vascular: Major vessel flow voids at the skull base are preserved. Skull and upper cervical spine: Normal marrow signal is preserved. Sinuses/Orbits: Paranasal sinus mucosal thickening. Orbits are unremarkable. Other: Sella is unremarkable.  Mastoid air cells are clear. MRA HEAD Intracranial internal carotid arteries are patent. Middle and anterior cerebral arteries are patent. Intracranial vertebral arteries, basilar artery, posterior cerebral arteries are patent. There is no significant stenosis or aneurysm. MRA NECK Motion artifact is present. Great vessel origins are patent. Common, internal, and external carotid arteries are  patent. There is likely plaque at the ICA origins without hemodynamically significant stenosis. Extracranial vertebral arteries are patent. Left vertebral is dominant. IMPRESSION: No acute infarction, hemorrhage, or mass. Mild to moderate chronic microvascular ischemic changes. No large vessel occlusion, hemodynamically significant stenosis, or evidence of dissection. Electronically Signed   By: Macy Mis M.D.   On: 01/26/2022 16:00   MR Angiogram Neck W or Wo Contrast  Result Date: 01/26/2022 CLINICAL DATA:  Neuro deficit, acute, stroke suspected EXAM: MRI HEAD WITHOUT CONTRAST MRA HEAD WITHOUT CONTRAST MRA NECK WITHOUT AND WITH CONTRAST TECHNIQUE: Multiplanar, multi-echo pulse sequences of the brain and surrounding structures were acquired without intravenous contrast. Angiographic images of the Circle of Willis were acquired using MRA technique without intravenous contrast. Angiographic images of the neck were acquired using MRA technique without and with intravenous contrast. Carotid stenosis measurements (when applicable) are  obtained utilizing NASCET criteria, using the distal internal carotid diameter as the denominator. CONTRAST:  7.3mL GADAVIST GADOBUTROL 1 MMOL/ML IV SOLN COMPARISON:  MRI head 2019 FINDINGS: MRI HEAD Brain: There is no acute infarction or intracranial hemorrhage. There is no intracranial mass, mass effect, or edema. There is no hydrocephalus or extra-axial fluid collection. Ventricles and sulci are within normal limits in size and configuration. Patchy and confluent areas of T2 hyperintensity in the supratentorial white matter are nonspecific but may reflect mild to moderate chronic microvascular ischemic changes. Vascular: Major vessel flow voids at the skull base are preserved. Skull and upper cervical spine: Normal marrow signal is preserved. Sinuses/Orbits: Paranasal sinus mucosal thickening. Orbits are unremarkable. Other: Sella is unremarkable.  Mastoid air cells are clear.  MRA HEAD Intracranial internal carotid arteries are patent. Middle and anterior cerebral arteries are patent. Intracranial vertebral arteries, basilar artery, posterior cerebral arteries are patent. There is no significant stenosis or aneurysm. MRA NECK Motion artifact is present. Great vessel origins are patent. Common, internal, and external carotid arteries are patent. There is likely plaque at the ICA origins without hemodynamically significant stenosis. Extracranial vertebral arteries are patent. Left vertebral is dominant. IMPRESSION: No acute infarction, hemorrhage, or mass. Mild to moderate chronic microvascular ischemic changes. No large vessel occlusion, hemodynamically significant stenosis, or evidence of dissection. Electronically Signed   By: Macy Mis M.D.   On: 01/26/2022 16:00   MR BRAIN WO CONTRAST  Result Date: 01/26/2022 CLINICAL DATA:  Neuro deficit, acute, stroke suspected EXAM: MRI HEAD WITHOUT CONTRAST MRA HEAD WITHOUT CONTRAST MRA NECK WITHOUT AND WITH CONTRAST TECHNIQUE: Multiplanar, multi-echo pulse sequences of the brain and surrounding structures were acquired without intravenous contrast. Angiographic images of the Circle of Willis were acquired using MRA technique without intravenous contrast. Angiographic images of the neck were acquired using MRA technique without and with intravenous contrast. Carotid stenosis measurements (when applicable) are obtained utilizing NASCET criteria, using the distal internal carotid diameter as the denominator. CONTRAST:  7.8mL GADAVIST GADOBUTROL 1 MMOL/ML IV SOLN COMPARISON:  MRI head 2019 FINDINGS: MRI HEAD Brain: There is no acute infarction or intracranial hemorrhage. There is no intracranial mass, mass effect, or edema. There is no hydrocephalus or extra-axial fluid collection. Ventricles and sulci are within normal limits in size and configuration. Patchy and confluent areas of T2 hyperintensity in the supratentorial white matter are  nonspecific but may reflect mild to moderate chronic microvascular ischemic changes. Vascular: Major vessel flow voids at the skull base are preserved. Skull and upper cervical spine: Normal marrow signal is preserved. Sinuses/Orbits: Paranasal sinus mucosal thickening. Orbits are unremarkable. Other: Sella is unremarkable.  Mastoid air cells are clear. MRA HEAD Intracranial internal carotid arteries are patent. Middle and anterior cerebral arteries are patent. Intracranial vertebral arteries, basilar artery, posterior cerebral arteries are patent. There is no significant stenosis or aneurysm. MRA NECK Motion artifact is present. Great vessel origins are patent. Common, internal, and external carotid arteries are patent. There is likely plaque at the ICA origins without hemodynamically significant stenosis. Extracranial vertebral arteries are patent. Left vertebral is dominant. IMPRESSION: No acute infarction, hemorrhage, or mass. Mild to moderate chronic microvascular ischemic changes. No large vessel occlusion, hemodynamically significant stenosis, or evidence of dissection. Electronically Signed   By: Macy Mis M.D.   On: 01/26/2022 16:00    Procedures Procedures    Medications Ordered in ED Medications  gadobutrol (GADAVIST) 1 MMOL/ML injection 7.5 mL (7.5 mLs Intravenous Contrast Given 01/26/22 1545)  ED Course/ Medical Decision Making/ A&P                           Medical Decision Making Amount and/or Complexity of Data Reviewed Labs: ordered. Radiology: ordered.  Risk Prescription drug management.   Alert 81 year old male in no acute distress, nontoxic-appearing.  Presents to the emergency department with a chief complaint of gait abnormality.  Information was obtained from patient and patient's family member at bedside.  Past medical records were reviewed including previous provider notes and labs.  Patient has medical history as outlined in HPI which complicates his  care.  Patient reports gait normality which can progressively worse over the last 3 days.  He endorses weakness to bilateral lower extremities.  Patient noted to have shuffled gait with standing and ambulation.  Decreased reflexes to right patella and Achilles.  Sensation to light touch grossly intact to bilateral upper and lower extremities.  Due to change in gait concern for possible acute CVA.  Will obtain lab work, noncontrast head CT as well as MRI and MRI of head and neck.  Lab testing was independently interpreted by myself.  Pertinent labs include: -Potassium 3.3 -CBC unremarkable  Noncontrast head CT was independently reviewed myself and shows no acute intracranial abnormality.  MRI imaging of brain shows no acute infarction, hemorrhage or mass.  No large vessel occlusion, hemodynamically significant stenosis, or evidence of dissection.  While MRI imaging is unremarkable still concern for patient's sudden and new change in gait.  We will consult neurology.  I spoke with Dr. Cheral Marker who recommended obtaining MRI of thoracic and lumbar spine with and without contrast.  We will plan to reconsult neurology once this lab work is obtained.  Patient is unable to receive imaging at this time due to receiving contrast dye earlier today.  Patient will need to have imaging performed after 24 hours.  We will plan to admit patient until imaging can be performed as there is concern for increased fall risk due to his gait instability.  Patient is unwilling to be admitted to the hospital at this time.  He was advised that due to his change in gait there is concern for increased fall risk which could lead to medical complications including but not limited to death.  Patient expresses understanding and continues to refuse admission at this time.  Patient vies that he will return to the emergency department tomorrow to have the MRI imaging performed.  Discussed strict return precautions with patient.  Will  place ambulatory referral for neurology at this time.  Patient was discussed with and evaluated by Dr. Vanita Panda.         Final Clinical Impression(s) / ED Diagnoses Final diagnoses:  Gait abnormality    Rx / DC Orders ED Discharge Orders          Ordered    Ambulatory referral to Neurology       Comments: An appointment is requested in approximately: 1 week   01/26/22 1722              Dyann Ruddle 01/26/22 1857    Carmin Muskrat, MD 01/27/22 [phone]

## 2022-01-26 NOTE — Discharge Instructions (Addendum)
You came to the emergency department today to be evaluated for your trouble walking.  You will need to have a MRI of your lumbar and thoracic spine with and without contrast.  Please return to [facility] emergency department in 24 hours to have this imaging done.  I have also placed a referral to [facility] neurology for a follow-up appointment in the outpatient setting.  Get help right away if: You have any symptoms of a stroke. "BE FAST" is an easy way to remember the main warning signs of a stroke: B - Balance. Signs are dizziness, sudden trouble walking, or loss of balance. E - Eyes. Signs are trouble seeing or a sudden change in vision. F - Face. Signs are sudden weakness or numbness of the face, or the face or eyelid drooping on one side. A - Arms. Signs are weakness or numbness in an arm. This happens suddenly and usually on one side of the body. S - Speech. Signs are sudden trouble speaking, slurred speech, or trouble understanding what people say. T - Time. Time to call emergency services. Write down what time symptoms started. You have other signs of a stroke, such as: A sudden, severe headache with no known cause. Nausea or vomiting. Seizure.

## 2022-01-26 NOTE — ED Notes (Signed)
Patient requested to get his IV heplock out and to leave. Patient A&O x 4. Grandson at bedside. States "he is stubborn and will not stay the night"

## 2022-01-26 NOTE — ED Notes (Signed)
To MRI via stretcher  

## 2022-06-17 ENCOUNTER — Observation Stay (HOSPITAL_COMMUNITY)
Admission: EM | Admit: 2022-06-17 | Discharge: 2022-06-18 | Disposition: A | Discharge status: Home / Self Care | Payer: Medicare HMO | Attending: Emergency Medicine | Admitting: Emergency Medicine

## 2022-06-17 ENCOUNTER — Emergency Department (HOSPITAL_COMMUNITY): Payer: Medicare HMO

## 2022-06-17 DIAGNOSIS — Z7901 Long term (current) use of anticoagulants: Secondary | ICD-10-CM | POA: Insufficient documentation

## 2022-06-17 DIAGNOSIS — T50901A Poisoning by unspecified drugs, medicaments and biological substances, accidental (unintentional), initial encounter: Secondary | ICD-10-CM | POA: Insufficient documentation

## 2022-06-17 DIAGNOSIS — R0602 Shortness of breath: Secondary | ICD-10-CM

## 2022-06-17 DIAGNOSIS — Z96611 Presence of right artificial shoulder joint: Secondary | ICD-10-CM | POA: Diagnosis not present

## 2022-06-17 DIAGNOSIS — I10 Essential (primary) hypertension: Secondary | ICD-10-CM | POA: Insufficient documentation

## 2022-06-17 DIAGNOSIS — Z96612 Presence of left artificial shoulder joint: Secondary | ICD-10-CM | POA: Insufficient documentation

## 2022-06-17 DIAGNOSIS — R778 Other specified abnormalities of plasma proteins: Secondary | ICD-10-CM | POA: Insufficient documentation

## 2022-06-17 DIAGNOSIS — I4891 Unspecified atrial fibrillation: Secondary | ICD-10-CM | POA: Diagnosis present

## 2022-06-17 DIAGNOSIS — Z79899 Other long term (current) drug therapy: Secondary | ICD-10-CM | POA: Diagnosis not present

## 2022-06-17 DIAGNOSIS — N400 Enlarged prostate without lower urinary tract symptoms: Secondary | ICD-10-CM | POA: Diagnosis present

## 2022-06-17 DIAGNOSIS — D72829 Elevated white blood cell count, unspecified: Secondary | ICD-10-CM | POA: Insufficient documentation

## 2022-06-17 DIAGNOSIS — I480 Paroxysmal atrial fibrillation: Secondary | ICD-10-CM | POA: Diagnosis not present

## 2022-06-17 DIAGNOSIS — E876 Hypokalemia: Secondary | ICD-10-CM | POA: Insufficient documentation

## 2022-06-17 DIAGNOSIS — Z96653 Presence of artificial knee joint, bilateral: Secondary | ICD-10-CM | POA: Diagnosis not present

## 2022-06-17 DIAGNOSIS — T426X1A Poisoning by other antiepileptic and sedative-hypnotic drugs, accidental (unintentional), initial encounter: Secondary | ICD-10-CM | POA: Diagnosis present

## 2022-06-17 DIAGNOSIS — E782 Mixed hyperlipidemia: Secondary | ICD-10-CM | POA: Diagnosis present

## 2022-06-17 LAB — CBC WITH DIFFERENTIAL/PLATELET
Abs Immature Granulocytes: 0.1 10*3/uL — ABNORMAL HIGH (ref 0.00–0.07)
Basophils Absolute: 0.1 10*3/uL (ref 0.0–0.1)
Basophils Relative: 0 %
Eosinophils Absolute: 0.2 10*3/uL (ref 0.0–0.5)
Eosinophils Relative: 2 %
HCT: 37.7 % — ABNORMAL LOW (ref 39.0–52.0)
Hemoglobin: 12.8 g/dL — ABNORMAL LOW (ref 13.0–17.0)
Immature Granulocytes: 1 %
Lymphocytes Relative: 23 %
Lymphs Abs: 2.9 10*3/uL (ref 0.7–4.0)
MCH: 30.6 pg (ref 26.0–34.0)
MCHC: 34 g/dL (ref 30.0–36.0)
MCV: 90.2 fL (ref 80.0–100.0)
Monocytes Absolute: 1.3 10*3/uL — ABNORMAL HIGH (ref 0.1–1.0)
Monocytes Relative: 10 %
Neutro Abs: 7.9 10*3/uL — ABNORMAL HIGH (ref 1.7–7.7)
Neutrophils Relative %: 64 %
Platelets: 290 10*3/uL (ref 150–400)
RBC: 4.18 MIL/uL — ABNORMAL LOW (ref 4.22–5.81)
RDW: 13.7 % (ref 11.5–15.5)
WBC: 12.4 10*3/uL — ABNORMAL HIGH (ref 4.0–10.5)
nRBC: 0 % (ref 0.0–0.2)

## 2022-06-17 LAB — SALICYLATE LEVEL: Salicylate Lvl: <7 mg/dL — ABNORMAL LOW (ref 7.0–30.0)

## 2022-06-17 LAB — ETHANOL: Alcohol, Ethyl (B): <10 mg/dL (ref <10)

## 2022-06-17 LAB — COMPREHENSIVE METABOLIC PANEL
ALT: 9 U/L (ref 0–44)
AST: 19 U/L (ref 15–41)
Albumin: 2.9 g/dL — ABNORMAL LOW (ref 3.5–5.0)
Alkaline Phosphatase: 59 U/L (ref 38–126)
Anion gap: 11 (ref 5–15)
BUN: 15 mg/dL (ref 8–23)
CO2: 15 mmol/L — ABNORMAL LOW (ref 22–32)
Calcium: 8.6 mg/dL — ABNORMAL LOW (ref 8.9–10.3)
Chloride: 112 mmol/L — ABNORMAL HIGH (ref 98–111)
Creatinine, Ser: 1.06 mg/dL (ref 0.61–1.24)
GFR, Estimated: >60 mL/min (ref >60)
Glucose, Bld: 101 mg/dL — ABNORMAL HIGH (ref 70–99)
Potassium: 2.7 mmol/L — CL (ref 3.5–5.1)
Sodium: 138 mmol/L (ref 135–145)
Total Bilirubin: 0.6 mg/dL (ref 0.3–1.2)
Total Protein: 5.5 g/dL — ABNORMAL LOW (ref 6.5–8.1)

## 2022-06-17 LAB — TROPONIN I (HIGH SENSITIVITY): Troponin I (High Sensitivity): 34 ng/L — ABNORMAL HIGH (ref <18)

## 2022-06-17 LAB — ACETAMINOPHEN LEVEL: Acetaminophen (Tylenol), Serum: 30 ug/mL (ref 10–30)

## 2022-06-17 MED ORDER — LACTATED RINGERS IV BOLUS
1000.0000 mL | Freq: Once | INTRAVENOUS | Status: AC
  Administered 2022-06-17: 1000 mL via INTRAVENOUS

## 2022-06-17 MED ORDER — POTASSIUM CHLORIDE 10 MEQ/100ML IV SOLN
10.0000 meq | INTRAVENOUS | Status: AC
  Administered 2022-06-18 (×2): 10 meq via INTRAVENOUS
  Filled 2022-06-17 (×2): qty 100

## 2022-06-17 MED ORDER — POTASSIUM CHLORIDE CRYS ER 20 MEQ PO TBCR
40.0000 meq | EXTENDED_RELEASE_TABLET | Freq: Once | ORAL | Status: AC
  Administered 2022-06-18: 40 meq via ORAL
  Filled 2022-06-17: qty 2

## 2022-06-17 MED ORDER — POTASSIUM CHLORIDE 10 MEQ/100ML IV SOLN
10.0000 meq | Freq: Once | INTRAVENOUS | Status: AC
  Administered 2022-06-18: 10 meq via INTRAVENOUS
  Filled 2022-06-17: qty 100

## 2022-06-17 MED ORDER — DILTIAZEM HCL-DEXTROSE 125-5 MG/125ML-% IV SOLN (PREMIX)
5.0000 mg/h | INTRAVENOUS | Status: DC
  Administered 2022-06-17: 5 mg/h via INTRAVENOUS
  Filled 2022-06-17: qty 125

## 2022-06-17 MED ORDER — DILTIAZEM LOAD VIA INFUSION
20.0000 mg | Freq: Once | INTRAVENOUS | Status: AC
  Administered 2022-06-17: 20 mg via INTRAVENOUS
  Filled 2022-06-17: qty 20

## 2022-06-17 NOTE — ED Triage Notes (Signed)
[name] from home due to SOB THC gummies around 3 or 4 unknown strength. Taking these due to abuse of his pain meds and they stopped his pain meds. First time taking THC.non-compliant with all meds except Ambien in the last two days he has taken 35 pills unknown strength unable to produce the bottle. PIV 18ga rt fa, lung clear equal and bilat,Pupils are equal and reactive. C/o CP during transport that was pressure over lt chest non radiation.

## 2022-06-17 NOTE — ED Notes (Signed)
Registration at bedside.

## 2022-06-17 NOTE — ED Provider Notes (Signed)
[facility] EMERGENCY DEPARTMENT Provider Note   CSN: [id number] Arrival date & time: 06/17/22  2125     History {Add pertinent medical, surgical, social history, OB history to HPI:1} Chief Complaint  Patient presents with   Shortness of Breath    Mitchell Harris is a 81 y.o. male.   Shortness of Breath  Patient is a 81 year old male with a history of HTN, arthritis who presents emergency department for evaluation of shortness of breath.     Home Medications Prior to Admission medications   Medication Sig Start Date End Date Taking? Authorizing Provider  amLODipine (NORVASC) 10 MG tablet TAKE 1 TABLET BY MOUTH DAILY 06/30/19   Toniann Fail, NP  atorvastatin (LIPITOR) 10 MG tablet Take 10 mg by mouth daily at 6 PM.     [provider]  celecoxib (CELEBREX) 100 MG capsule Take 100 mg by mouth daily.    [provider]  HYDROmorphone (DILAUDID) 2 MG tablet Take 1 tablet (2 mg total) by mouth every 4 (four) hours as needed for severe pain. Patient not taking: Reported on 04/07/2019 11/03/15   Teryl Lucy, MD  losartan (COZAAR) 100 MG tablet Take 100 mg by mouth daily after supper.     [provider]  meloxicam (MOBIC) 15 MG tablet Take 1 tablet (15 mg total) by mouth daily. Patient not taking: Reported on 04/07/2019 03/28/17   Asencion Islam, DPM  methocarbamol (ROBAXIN) 750 MG tablet Take 750 mg by mouth 4 (four) times daily.    [provider]  ondansetron (ZOFRAN) 4 MG tablet Take 1 tablet (4 mg total) by mouth every 8 (eight) hours as needed for nausea or vomiting. Patient not taking: Reported on 04/07/2019 11/03/15   Teryl Lucy, MD  oxyCODONE-acetaminophen (PERCOCET) 10-325 MG tablet Take 1-2 tablets by mouth every 6 (six) hours as needed for pain. MAXIMUM TOTAL ACETAMINOPHEN DOSE IS 4000 MG PER DAY 11/03/15   Teryl Lucy, MD  sennosides-docusate sodium (SENOKOT-S) 8.6-50 MG tablet Take 2 tablets by mouth  daily. Patient not taking: Reported on 04/07/2019 11/03/15   Teryl Lucy, MD  tamsulosin (FLOMAX) 0.4 MG CAPS capsule Take 0.4 mg by mouth daily after supper.     [provider]  zolpidem (AMBIEN) 10 MG tablet Take 10 mg by mouth at bedtime.    [provider]      Allergies    Patient has no known allergies.    Review of Systems   Review of Systems  Respiratory:  Positive for shortness of breath.     Physical Exam Updated Vital Signs BP 100/87   Pulse (!) 39   SpO2 100%  Physical Exam  ED Results / Procedures / Treatments   Labs (all labs ordered are listed, but only abnormal results are displayed) Labs Reviewed  CBC WITH DIFFERENTIAL/PLATELET  COMPREHENSIVE METABOLIC PANEL  ETHANOL  SALICYLATE LEVEL  ACETAMINOPHEN LEVEL  RAPID URINE DRUG SCREEN, HOSP PERFORMED  TROPONIN I (HIGH SENSITIVITY)    EKG None  Radiology No results found.  Procedures Procedures  {Document cardiac monitor, telemetry assessment procedure when appropriate:1}  Medications Ordered in ED Medications - No data to display  ED Course/ Medical Decision Making/ A&P                           Medical Decision Making Amount and/or Complexity of Data Reviewed Labs: ordered. Radiology: ordered.  Risk Prescription drug management.   ***  {Document  critical care time when appropriate:1} {Document review of labs and clinical decision tools ie heart score, Chads2Vasc2 etc:1}  {Document your independent review of radiology images, and any outside records:1} {Document your discussion with family members, caretakers, and with consultants:1} {Document social determinants of health affecting pt's care:1} {Document your decision making why or why not admission, treatments were needed:1} Final Clinical Impression(s) / ED Diagnoses Final diagnoses:  None    Rx / DC Orders ED Discharge Orders     None

## 2022-06-17 NOTE — ED Triage Notes (Deleted)
Patient arrives to ED via EMS with resp distress. Patient was at home, being doing albuterol treatments all day with no relief, was 80% o2 on room air.

## 2022-06-18 ENCOUNTER — Other Ambulatory Visit: Payer: Self-pay

## 2022-06-18 ENCOUNTER — Other Ambulatory Visit (HOSPITAL_COMMUNITY): Payer: Self-pay

## 2022-06-18 ENCOUNTER — Observation Stay (HOSPITAL_COMMUNITY): Payer: Medicare HMO

## 2022-06-18 ENCOUNTER — Observation Stay (HOSPITAL_BASED_OUTPATIENT_CLINIC_OR_DEPARTMENT_OTHER): Payer: Medicare HMO

## 2022-06-18 ENCOUNTER — Encounter (HOSPITAL_COMMUNITY): Payer: Self-pay | Admitting: Internal Medicine

## 2022-06-18 ENCOUNTER — Telehealth (HOSPITAL_COMMUNITY): Payer: Self-pay | Admitting: Pharmacy Technician

## 2022-06-18 DIAGNOSIS — E876 Hypokalemia: Secondary | ICD-10-CM

## 2022-06-18 DIAGNOSIS — I4891 Unspecified atrial fibrillation: Secondary | ICD-10-CM | POA: Diagnosis not present

## 2022-06-18 DIAGNOSIS — T426X1A Poisoning by other antiepileptic and sedative-hypnotic drugs, accidental (unintentional), initial encounter: Secondary | ICD-10-CM

## 2022-06-18 DIAGNOSIS — E782 Mixed hyperlipidemia: Secondary | ICD-10-CM

## 2022-06-18 DIAGNOSIS — I10 Essential (primary) hypertension: Secondary | ICD-10-CM

## 2022-06-18 DIAGNOSIS — N400 Enlarged prostate without lower urinary tract symptoms: Secondary | ICD-10-CM

## 2022-06-18 DIAGNOSIS — R778 Other specified abnormalities of plasma proteins: Secondary | ICD-10-CM

## 2022-06-18 DIAGNOSIS — D72829 Elevated white blood cell count, unspecified: Secondary | ICD-10-CM

## 2022-06-18 LAB — CBC WITH DIFFERENTIAL/PLATELET
Abs Immature Granulocytes: 0.17 10*3/uL — ABNORMAL HIGH (ref 0.00–0.07)
Basophils Absolute: 0.1 10*3/uL (ref 0.0–0.1)
Basophils Relative: 1 %
Eosinophils Absolute: 0.3 10*3/uL (ref 0.0–0.5)
Eosinophils Relative: 2 %
HCT: 37.6 % — ABNORMAL LOW (ref 39.0–52.0)
Hemoglobin: 13 g/dL (ref 13.0–17.0)
Immature Granulocytes: 1 %
Lymphocytes Relative: 25 %
Lymphs Abs: 3.2 10*3/uL (ref 0.7–4.0)
MCH: 30.9 pg (ref 26.0–34.0)
MCHC: 34.6 g/dL (ref 30.0–36.0)
MCV: 89.3 fL (ref 80.0–100.0)
Monocytes Absolute: 1.2 10*3/uL — ABNORMAL HIGH (ref 0.1–1.0)
Monocytes Relative: 9 %
Neutro Abs: 8 10*3/uL — ABNORMAL HIGH (ref 1.7–7.7)
Neutrophils Relative %: 62 %
Platelets: 284 10*3/uL (ref 150–400)
RBC: 4.21 MIL/uL — ABNORMAL LOW (ref 4.22–5.81)
RDW: 13.8 % (ref 11.5–15.5)
WBC: 12.8 10*3/uL — ABNORMAL HIGH (ref 4.0–10.5)
nRBC: 0 % (ref 0.0–0.2)

## 2022-06-18 LAB — COMPREHENSIVE METABOLIC PANEL
ALT: 11 U/L (ref 0–44)
AST: 18 U/L (ref 15–41)
Albumin: 3 g/dL — ABNORMAL LOW (ref 3.5–5.0)
Alkaline Phosphatase: 57 U/L (ref 38–126)
Anion gap: 12 (ref 5–15)
BUN: 15 mg/dL (ref 8–23)
CO2: 17 mmol/L — ABNORMAL LOW (ref 22–32)
Calcium: 9.1 mg/dL (ref 8.9–10.3)
Chloride: 107 mmol/L (ref 98–111)
Creatinine, Ser: 1.14 mg/dL (ref 0.61–1.24)
GFR, Estimated: >60 mL/min (ref >60)
Glucose, Bld: 109 mg/dL — ABNORMAL HIGH (ref 70–99)
Potassium: 3.5 mmol/L (ref 3.5–5.1)
Sodium: 136 mmol/L (ref 135–145)
Total Bilirubin: 0.6 mg/dL (ref 0.3–1.2)
Total Protein: 5.4 g/dL — ABNORMAL LOW (ref 6.5–8.1)

## 2022-06-18 LAB — ECHOCARDIOGRAM COMPLETE
AR max vel: 1.46 cm2
AV Area VTI: 1.39 cm2
AV Area mean vel: 1.18 cm2
AV Mean grad: 7 mmHg
AV Peak grad: 11 mmHg
Ao pk vel: 1.66 m/s
Area-P 1/2: 2.76 cm2
Height: 68 in
MV VTI: 0.83 cm2
S' Lateral: 3.25 cm
Weight: 2720 oz

## 2022-06-18 LAB — I-STAT VENOUS BLOOD GAS, ED
Acid-base deficit: 3 mmol/L — ABNORMAL HIGH (ref 0.0–2.0)
Bicarbonate: 18.8 mmol/L — ABNORMAL LOW (ref 20.0–28.0)
Calcium, Ion: 1.23 mmol/L (ref 1.15–1.40)
HCT: 38 % — ABNORMAL LOW (ref 39.0–52.0)
Hemoglobin: 12.9 g/dL — ABNORMAL LOW (ref 13.0–17.0)
O2 Saturation: 99 %
Potassium: 3.7 mmol/L (ref 3.5–5.1)
Sodium: 136 mmol/L (ref 135–145)
TCO2: 20 mmol/L — ABNORMAL LOW (ref 22–32)
pCO2, Ven: 25.3 mmHg — ABNORMAL LOW (ref 44–60)
pH, Ven: 7.48 — ABNORMAL HIGH (ref 7.25–7.43)
pO2, Ven: 133 mmHg — ABNORMAL HIGH (ref 32–45)

## 2022-06-18 LAB — D-DIMER, QUANTITATIVE: D-Dimer, Quant: 0.58 ug/mL-FEU — ABNORMAL HIGH (ref 0.00–0.50)

## 2022-06-18 LAB — T4, FREE: Free T4: 1.07 ng/dL (ref 0.61–1.12)

## 2022-06-18 LAB — TSH
TSH: 1.062 u[IU]/mL (ref 0.350–4.500)
TSH: 1.01 u[IU]/mL (ref 0.350–4.500)

## 2022-06-18 LAB — MAGNESIUM
Magnesium: 1.5 mg/dL — ABNORMAL LOW (ref 1.7–2.4)
Magnesium: 1.7 mg/dL (ref 1.7–2.4)

## 2022-06-18 LAB — LACTIC ACID, PLASMA
Lactic Acid, Venous: 3.3 mmol/L (ref 0.5–1.9)
Lactic Acid, Venous: 2 mmol/L (ref 0.5–1.9)

## 2022-06-18 LAB — TROPONIN I (HIGH SENSITIVITY): Troponin I (High Sensitivity): 42 ng/L — ABNORMAL HIGH (ref <18)

## 2022-06-18 MED ORDER — ONDANSETRON HCL 4 MG PO TABS: 4.0000 mg | ORAL_TABLET | Freq: Four times a day (QID) | ORAL | Status: DC | PRN

## 2022-06-18 MED ORDER — APIXABAN 5 MG PO TABS
5.0000 mg | ORAL_TABLET | Freq: Two times a day (BID) | ORAL | Status: DC
  Administered 2022-06-18 (×2): 5 mg via ORAL
  Filled 2022-06-18 (×2): qty 1

## 2022-06-18 MED ORDER — DILTIAZEM HCL ER COATED BEADS 120 MG PO CP24
120.0000 mg | ORAL_CAPSULE | Freq: Every day | ORAL | Status: DC
Start: 2022-06-18 — End: 2022-06-18
  Administered 2022-06-18: 120 mg via ORAL
  Filled 2022-06-18: qty 1

## 2022-06-18 MED ORDER — HYDROXYZINE HCL 25 MG PO TABS
25.0000 mg | ORAL_TABLET | Freq: Once | ORAL | Status: AC
  Administered 2022-06-18: 25 mg via ORAL
  Filled 2022-06-18: qty 1

## 2022-06-18 MED ORDER — LOSARTAN POTASSIUM 50 MG PO TABS
100.0000 mg | ORAL_TABLET | Freq: Every day | ORAL | Status: DC
  Administered 2022-06-18: 100 mg via ORAL
  Filled 2022-06-18: qty 2

## 2022-06-18 MED ORDER — POLYETHYLENE GLYCOL 3350 17 G PO PACK: 17.0000 g | PACK | Freq: Every day | ORAL | Status: DC | PRN

## 2022-06-18 MED ORDER — ZOLPIDEM TARTRATE 5 MG PO TABS: 10.0000 mg | ORAL_TABLET | Freq: Every evening | ORAL | Status: AC | PRN

## 2022-06-18 MED ORDER — DILTIAZEM HCL ER COATED BEADS 120 MG PO CP24
120.0000 mg | ORAL_CAPSULE | Freq: Every day | ORAL | 1 refills | Status: AC
  Filled 2022-06-18: qty 30, 30d supply, fill #0

## 2022-06-18 MED ORDER — TAMSULOSIN HCL 0.4 MG PO CAPS
0.4000 mg | ORAL_CAPSULE | Freq: Every day | ORAL | Status: DC
  Administered 2022-06-18: 0.4 mg via ORAL
  Filled 2022-06-18: qty 1

## 2022-06-18 MED ORDER — APIXABAN 5 MG PO TABS
5.0000 mg | ORAL_TABLET | Freq: Two times a day (BID) | ORAL | 1 refills | Status: AC
  Filled 2022-06-18: qty 60, 30d supply, fill #0

## 2022-06-18 MED ORDER — ONDANSETRON HCL 4 MG/2ML IJ SOLN: 4.0000 mg | Freq: Four times a day (QID) | INTRAMUSCULAR | Status: DC | PRN

## 2022-06-18 MED ORDER — ACETAMINOPHEN 325 MG PO TABS: 650.0000 mg | ORAL_TABLET | Freq: Four times a day (QID) | ORAL | Status: DC | PRN

## 2022-06-18 MED ORDER — MAGNESIUM SULFATE 50 % IJ SOLN
3.0000 g | Freq: Once | INTRAVENOUS | Status: AC
  Administered 2022-06-18: 3 g via INTRAVENOUS
  Filled 2022-06-18: qty 6

## 2022-06-18 MED ORDER — IOHEXOL 350 MG/ML SOLN
50.0000 mL | Freq: Once | INTRAVENOUS | Status: AC | PRN
  Administered 2022-06-18: 50 mL via INTRAVENOUS

## 2022-06-18 MED ORDER — HYDRALAZINE HCL 20 MG/ML IJ SOLN: 10.0000 mg | Freq: Four times a day (QID) | INTRAMUSCULAR | Status: DC | PRN

## 2022-06-18 MED ORDER — METOPROLOL TARTRATE 25 MG PO TABS
25.0000 mg | ORAL_TABLET | Freq: Two times a day (BID) | ORAL | Status: DC
  Administered 2022-06-18: 25 mg via ORAL
  Filled 2022-06-18: qty 1

## 2022-06-18 MED ORDER — ACETAMINOPHEN 650 MG RE SUPP: 650.0000 mg | Freq: Four times a day (QID) | RECTAL | Status: DC | PRN

## 2022-06-18 MED ORDER — MELATONIN 5 MG PO TABS
10.0000 mg | ORAL_TABLET | Freq: Every evening | ORAL | Status: DC | PRN
  Administered 2022-06-18: 10 mg via ORAL
  Filled 2022-06-18: qty 2

## 2022-06-18 MED ORDER — LACTATED RINGERS IV SOLN: INTRAVENOUS | Status: AC

## 2022-06-18 MED ORDER — ATORVASTATIN CALCIUM 10 MG PO TABS
10.0000 mg | ORAL_TABLET | Freq: Every day | ORAL | Status: DC
  Administered 2022-06-18: 10 mg via ORAL
  Filled 2022-06-18: qty 1

## 2022-06-18 NOTE — Assessment & Plan Note (Signed)
.   Patient presenting with atrial fibrillation with rapid ventricular response . Patient initiated on intravenous cardizem by the emergency department staff , will continue for now.  . CHA2DS2-VASc score noted to be: 3 Indicating that full dose anticoagulation is warranted.  Initiating Eliquis . Obtaining echocardiogram in the morning . Monitoring patient on telemetry . Obtaining cardiology consultation the morning . Furthermore, obtaining magnesium, TSH, urine drug screen, troponin and D-dimer to evaluate for possible etiologies

## 2022-06-18 NOTE — Telephone Encounter (Signed)
Pharmacy Patient Advocate Encounter  Insurance verification completed.    The patient is insured through [employer] Part D   The patient is currently admitted and ran test claims for the following: Eliquis .  Copays and coinsurance results were relayed to Inpatient clinical team.      

## 2022-06-18 NOTE — ED Notes (Signed)
Patient keeps insisting on having Iv taken out. Patient was educated on the importance of the Iv's staying in.

## 2022-06-18 NOTE — Assessment & Plan Note (Signed)
   Slightly elevated troponin  No evidence of dynamic ST segment change  Elevated troponin likely secondary to supply/demand mismatch in the setting of rapid atrial fibrillation  Monitoring patient on telemetry  Cycling cardiac enzymes

## 2022-06-18 NOTE — Consult Note (Addendum)
Cardiology Consultation:   Patient ID: Mitchell Harris MRN: [id number]; DOB: 1941-04-04  Admit date: 06/17/2022 Date of Consult: 06/18/2022  PCP:  Beverley Fiedler, [place] Providers Cardiologist:  New [facility] - Dr. Lanetta Inch here to update MD or APP on Care Team, Refresh:1}     Patient Profile:   Mitchell Harris is a 81 y.o. male with a hx of HTN, HLD, osteoarthritis, anxiety, depression, macular degeneration, coronary/aortic atherosclerosis by imaging who is being seen 06/18/2022 for the evaluation of atrial fibrillation at the request of Dr. Tawanna Solo.  History of Present Illness:   Mr. Nunziata has no formal cardiac history but previously completed one virtual visit with [facility] in 04/2019 for fatigue and dyspnea on exertion. His blood pressure was not well controlled and meds were adjusted. Echo and stress test were ordered. He does not ever remember having these done. He otherwise denies any prior cardiac history. Denies any significant bleeding history or TIA/stroke. He denies taking any home meds recently except atorvastatin.  He was in his USOH yesterday afternoon when he suddenly developed onset of SOB as if something was compressing his chest. Due to persistent symptoms he came to to the ER where he was found to be in rapid atrial fib. He denies any chest pain or similar history of symptoms. The nurse triage note also outlines some additional substance concerns: "[name] from home due to SOB THC gummies around 3 or 4 unknown strength. Taking these due to abuse of his pain meds and they stopped his pain meds. First time taking THC.non-compliant with all meds except Ambien in the last two days he has taken 35 pills unknown strength unable to produce the bottle." Currently the patient states he took an excessive amount of Ambien 3-4 weeks ago but denies any recent use. Labs notable for leukocytosis 12.4, hypokalemia of 2.7, hypomagnesemia  1.5, hypoalbuminemia 2.9, d-dimer 0.57, ETOH neg, thyroid function normal, lactic acid 3.3, hsTroponin 34->42, UDS ordered but never obtained yet. CXR NAD, + aortic atherosclerosis. CTA was negative for PE, did show 1V coronary atherosclerosis. 2D echo today showed EF 55-60%, technically challenging study, mildly reduced RVSF, mildly enlarged RV, moderate-severe MAC with mild MS, trivial MR, aortic sclerosis without stenosis.  He was started on Eliquis 77m BID and IV diltiazem as well as oral metoprolol. He also received IV fluids and electrolyte repletion. He converted to NSR overnight. He reminds me multiple times during the interview that he plans on leaving soon. I have also received several secure chat messages from those involved in his care that he is insistent on leaving. Has also refused to keep his IVs in during his hospital stays as well.   Past Medical History:  Diagnosis Date   Anxiety    Arthritis    OA- hands, knees, neck   Complication of anesthesia    Depression    Family history of adverse reaction to anesthesia    History of blood transfusion    Hyperlipidemia    Hypertension    Localized primary osteoarthritis of left shoulder region 11/01/2014   Macular degeneration    left eye, rec'ing injecctions    Neuromuscular disorder (HDunlap    cervical degeneration   PONV (postoperative nausea and vomiting)    Primary localized osteoarthrosis of right shoulder 11/03/2015    Past Surgical History:  Procedure Laterality Date   CERVICAL FUSION  2008   CHOLECYSTECTOMY     JOINT REPLACEMENT  2000&2007   both knees    KNEE SURGERY     arthroscopic- both knees , L knee reconstruction prior to replacement    SHOULDER ARTHROSCOPY     both shoulders    TONSILLECTOMY     TOTAL SHOULDER ARTHROPLASTY Left 11/01/2014   Procedure: TOTAL SHOULDER ARTHROPLASTY;  Surgeon: Johnny Bridge, MD;  Location: [place];  Service: Orthopedics;  Laterality: Left;   TOTAL SHOULDER ARTHROPLASTY Right  11/03/2015   Procedure: RIGHT TOTAL SHOULDER ARTHROPLASTY;  Surgeon: Marchia Bond, MD;  Location: [place];  Service: Orthopedics;  Laterality: Right;  ANESTHESIA:  GENERAL, PRE/POST OP SCALENE     Home Medications:  Prior to Admission medications   Medication Sig Start Date End Date Taking? Authorizing Provider  amLODipine (NORVASC) 10 MG tablet TAKE 1 TABLET BY MOUTH DAILY Patient taking differently: Take 10 mg by mouth daily. 06/30/19  Yes Miquel Dunn, NP  atorvastatin (LIPITOR) 10 MG tablet Take 10 mg by mouth daily.   Yes [provider]  losartan (COZAAR) 100 MG tablet Take 100 mg by mouth daily.   Yes [provider]  tamsulosin (FLOMAX) 0.4 MG CAPS capsule Take 0.4 mg by mouth daily.   Yes [provider]  zolpidem (AMBIEN) 10 MG tablet Take 10 mg by mouth at bedtime.    [provider]    Inpatient Medications: Scheduled Meds:  apixaban  5 mg Oral BID   atorvastatin  10 mg Oral Daily   losartan  100 mg Oral Daily   metoprolol tartrate  25 mg Oral BID   tamsulosin  0.4 mg Oral Daily   triamcinolone acetonide  10 mg Other Once   Continuous Infusions:  diltiazem (CARDIZEM) infusion Stopped (06/18/22 0757)   lactated ringers 125 mL/hr at 06/18/22 0930   PRN Meds: acetaminophen **OR** acetaminophen, hydrALAZINE, melatonin, ondansetron **OR** ondansetron (ZOFRAN) IV, polyethylene glycol  Allergies:   No Known Allergies  Social History:   Social History   Socioeconomic History   Marital status: Single    Spouse name: Not on file   Number of children: Not on file   Years of education: Not on file   Highest education level: Not on file  Occupational History   Not on file  Tobacco Use   Smoking status: Never   Smokeless tobacco: Never  Vaping Use   Vaping Use: Never used  Substance and Sexual Activity   Alcohol use: No   Drug use: No   Sexual activity: Not on file  Other Topics Concern   Not on file   Social History Narrative   Not on file   Social Determinants of Health   Financial Resource Strain: Not on file  Food Insecurity: Not on file  Transportation Needs: Not on file  Physical Activity: Not on file  Stress: Not on file  Social Connections: Not on file  Intimate Partner Violence: Not on file    Family History:   Family History  Problem Relation Age of Onset   Arthritis Mother    Pulmonary embolism Mother    Stroke Father    Heart attack Brother    Other Brother    Heart attack Brother      ROS:  Please see the history of present illness.  All other ROS reviewed and negative.     Physical Exam/Data:   Vitals:   06/18/22 0654 06/18/22 0656 06/18/22 0815 06/18/22 0945  BP:  (!) 162/88 126/78 (!) 150/88  Pulse:  93 (!)  54 74  Resp:   15 (!) 23  Temp: 98.7 F (37.1 C)     TempSrc: Oral     SpO2:   98% 100%  Weight:      Height:        Intake/Output Summary (Last 24 hours) at 06/18/2022 1141 Last data filed at 06/18/2022 0701 Gross per 24 hour  Intake 1514.14 ml  Output 750 ml  Net 764.14 ml      06/18/2022    1:14 AM 01/26/2022   12:34 PM 04/07/2019    9:20 AM  Last 3 Weights  Weight (lbs) 170 lb 165 lb 175 lb  Weight (kg) 77.111 kg 74.844 kg 79.379 kg     Body mass index is 25.85 kg/m.  General: Well developed, well nourished WM, in no acute distress. Head: Normocephalic, atraumatic, sclera non-icteric, no xanthomas, nares are without discharge. Neck: Negative for carotid bruits. JVP not elevated. Lungs: Clear bilaterally to auscultation without wheezes, rales, or rhonchi. Breathing is unlabored. Heart: RRR S1 S2 without murmurs, rubs, or gallops.  Abdomen: Soft, non-tender, non-distended with normoactive bowel sounds. No rebound/guarding. Extremities: No clubbing or cyanosis. No edema. Distal pedal pulses are 2+ and equal bilaterally. Neuro: Alert and oriented X 3. Moves all extremities spontaneously. Psych:  Responds to questions appropriately  with a normal affect.   EKG:  The EKG was personally reviewed and demonstrates:   Initial tracing: atrial fib 135bpm probable LVH with nonspecific STTW changes, occasional PVC F/u tracing: Afib 77bpm, possible LVH, nonspecific STTW changes Most recent tracing: Sinus tach 94bpm with occasional PACs, no acute STT changes, QTc 439m   Telemetry:  Telemetry was personally reviewed and demonstrates:  currently NSR with occasional PAC, PVC  Relevant CV Studies: 2D Echo 06/18/22  1. Left ventricular ejection fraction, by estimation, is 55 to 60%. The  left ventricle has normal function. Left ventricular endocardial border  not optimally defined to evaluate regional wall motion. Left ventricular  diastolic function could not be  evaluated.   2. Right ventricular systolic function is mildly reduced. The right  ventricular size is mildly enlarged.   3. Left atrial size was mild to moderately dilated.   4. The mitral valve is degenerative. Trivial mitral valve regurgitation.  Mild mitral stenosis. Moderate to severe mitral annular calcification.   5. The aortic valve is tricuspid. There is moderate calcification of the  aortic valve. There is moderate thickening of the aortic valve. Aortic  valve regurgitation is not visualized. Aortic valve  sclerosis/calcification is present, without any evidence  of aortic stenosis.   Laboratory Data:  High Sensitivity Troponin:   Recent Labs  Lab 06/17/22 2215 06/18/22 0326  TROPONINIHS 34* 42*     Chemistry Recent Labs  Lab 06/17/22 2215 06/18/22 0326 06/18/22 0337  NA 138 136 136  K 2.7* 3.5 3.7  CL 112* 107  --   CO2 15* 17*  --   GLUCOSE 101* 109*  --   BUN 15 15  --   CREATININE 1.06 1.14  --   CALCIUM 8.6* 9.1  --   MG 1.5* 1.7  --   GFRNONAA >60 >60  --   ANIONGAP 11 12  --     Recent Labs  Lab 06/17/22 2215 06/18/22 0326  PROT 5.5* 5.4*  ALBUMIN 2.9* 3.0*  AST 19 18  ALT 9 11  ALKPHOS 59 57  BILITOT 0.6 0.6   Lipids  No results for input(s): "CHOL", "TRIG", "HDL", "LABVLDL", "LDLCALC", "CHOLHDL" in  the last 168 hours.  Hematology Recent Labs  Lab 06/17/22 2215 06/18/22 0326 06/18/22 0337  WBC 12.4* 12.8*  --   RBC 4.18* 4.21*  --   HGB 12.8* 13.0 12.9*  HCT 37.7* 37.6* 38.0*  MCV 90.2 89.3  --   MCH 30.6 30.9  --   MCHC 34.0 34.6  --   RDW 13.7 13.8  --   PLT 290 284  --    Thyroid  Recent Labs  Lab 06/17/22 2215 06/18/22 0140  TSH 1.062 1.010  FREET4 1.07  --     BNPNo results for input(s): "BNP", "PROBNP" in the last 168 hours.  DDimer  Recent Labs  Lab 06/18/22 0326  DDIMER 0.58*     Radiology/Studies:  ECHOCARDIOGRAM COMPLETE  Result Date: 06/18/2022    ECHOCARDIOGRAM REPORT   Patient Name:   Benno Alphia Kava Date of Exam: 06/18/2022 Medical Rec #:  [id number]      Height:       68.0 in Accession #:    [id number]     Weight:       170.0 lb Date of Birth:  Sep 06, 1941      BSA:          1.907 m Patient Age:    29 years       BP:           98/72 mmHg Patient Gender: M              HR:           63 bpm. Exam Location:  Inpatient Procedure: 2D Echo, Cardiac Doppler and Color Doppler Indications:    A fib  History:        Patient has no prior history of Echocardiogram examinations.                 Risk Factors:Hypertension.  Sonographer:    Melissa Morford RDCS (AE, PE) Referring Phys: [id number] Sherryll Burger [facility]  Sonographer Comments: Technically challenging study due to limited acoustic windows, no subcostal window and suboptimal apical window. IMPRESSIONS  1. Left ventricular ejection fraction, by estimation, is 55 to 60%. The left ventricle has normal function. Left ventricular endocardial border not optimally defined to evaluate regional wall motion. Left ventricular diastolic function could not be evaluated.  2. Right ventricular systolic function is mildly reduced. The right ventricular size is mildly enlarged.  3. Left atrial size was mild to moderately dilated.  4. The mitral valve is  degenerative. Trivial mitral valve regurgitation. Mild mitral stenosis. Moderate to severe mitral annular calcification.  5. The aortic valve is tricuspid. There is moderate calcification of the aortic valve. There is moderate thickening of the aortic valve. Aortic valve regurgitation is not visualized. Aortic valve sclerosis/calcification is present, without any evidence of aortic stenosis. FINDINGS  Left Ventricle: Left ventricular ejection fraction, by estimation, is 55 to 60%. The left ventricle has normal function. Left ventricular endocardial border not optimally defined to evaluate regional wall motion. The left ventricular internal cavity size was normal in size. There is no left ventricular hypertrophy. Left ventricular diastolic function could not be evaluated. Right Ventricle: The right ventricular size is mildly enlarged. No increase in right ventricular wall thickness. Right ventricular systolic function is mildly reduced. Left Atrium: Left atrial size was mild to moderately dilated. Right Atrium: Right atrial size was normal in size. Pericardium: There is no evidence of pericardial effusion. Mitral Valve: The mitral valve is degenerative in appearance. Moderate to severe mitral  annular calcification. Trivial mitral valve regurgitation. Mild mitral valve stenosis. MV peak gradient, 7.7 mmHg. The mean mitral valve gradient is 3.7 mmHg. Tricuspid Valve: The tricuspid valve is grossly normal. Tricuspid valve regurgitation is trivial. No evidence of tricuspid stenosis. Aortic Valve: The aortic valve is tricuspid. There is moderate calcification of the aortic valve. There is moderate thickening of the aortic valve. Aortic valve regurgitation is not visualized. Aortic valve sclerosis/calcification is present, without any  evidence of aortic stenosis. Aortic valve mean gradient measures 7.0 mmHg. Aortic valve peak gradient measures 11.0 mmHg. Aortic valve area, by VTI measures 1.39 cm. Pulmonic Valve: The  pulmonic valve was grossly normal. Pulmonic valve regurgitation is trivial. No evidence of pulmonic stenosis. Aorta: The aortic root is normal in size and structure. Venous: The inferior vena cava was not well visualized. IAS/Shunts: The atrial septum is grossly normal.  LEFT VENTRICLE PLAX 2D LVIDd:         3.95 cm LVIDs:         3.25 cm LV PW:         1.00 cm LV IVS:        1.15 cm LVOT diam:     2.10 cm LV SV:         41 LV SV Index:   22 LVOT Area:     3.46 cm  RIGHT VENTRICLE RV S prime:     10.70 cm/s TAPSE (M-mode): 2.2 cm LEFT ATRIUM              Index        RIGHT ATRIUM           Index LA diam:        4.30 cm  2.25 cm/m   RA Area:     16.80 cm LA Vol (A2C):   108.0 ml 56.62 ml/m  RA Volume:   43.70 ml  22.91 ml/m LA Vol (A4C):   79.9 ml  41.89 ml/m LA Biplane Vol: 98.3 ml  51.54 ml/m  AORTIC VALVE AV Area (Vmax):    1.46 cm AV Area (Vmean):   1.18 cm AV Area (VTI):     1.39 cm AV Vmax:           166.00 cm/s AV Vmean:          124.000 cm/s AV VTI:            0.297 m AV Peak Grad:      11.0 mmHg AV Mean Grad:      7.0 mmHg LVOT Vmax:         69.90 cm/s LVOT Vmean:        42.400 cm/s LVOT VTI:          0.119 m LVOT/AV VTI ratio: 0.40  AORTA Ao Root diam: 3.10 cm MITRAL VALVE                TRICUSPID VALVE MV Area (PHT): 2.76 cm     TR Peak grad:   17.8 mmHg MV Area VTI:   0.83 cm     TR Vmax:        211.00 cm/s MV Peak grad:  7.7 mmHg MV Mean grad:  3.7 mmHg     SHUNTS MV Vmax:       1.38 m/s     Systemic VTI:  0.12 m MV Vmean:      91.5 cm/s    Systemic Diam: 2.10 cm MV Decel Time: 275 msec MV E velocity: 118.00 cm/s  MV A velocity: 158.00 cm/s MV E/A ratio:  0.75 Eleonore Chiquito MD Electronically signed by Eleonore Chiquito MD Signature Date/Time: 06/18/2022/10:03:30 AM    Final    CT Angio Chest Pulmonary Embolism (PE) W or WO Contrast  Result Date: 06/18/2022 CLINICAL DATA:  Pulmonary embolism (PE) suspected, positive D-dimer. Dyspnea. EXAM: CT ANGIOGRAPHY CHEST WITH CONTRAST TECHNIQUE:  Multidetector CT imaging of the chest was performed using the standard protocol during bolus administration of intravenous contrast. Multiplanar CT image reconstructions and MIPs were obtained to evaluate the vascular anatomy. RADIATION DOSE REDUCTION: This exam was performed according to the departmental dose-optimization program which includes automated exposure control, adjustment of the mA and/or kV according to patient size and/or use of iterative reconstruction technique. CONTRAST:  4m OMNIPAQUE IOHEXOL 350 MG/ML SOLN COMPARISON:  Chest radiograph from one day prior. 01/08/2008 chest CT angiogram. FINDINGS: Cardiovascular: The study is high quality for the evaluation of pulmonary embolism. There are no filling defects in the central, lobar, segmental or subsegmental pulmonary artery branches to suggest acute pulmonary embolism. Great vessels are normal in course and caliber. Top-normal heart size. No significant pericardial fluid/thickening. Left anterior descending coronary atherosclerosis. Mediastinum/Nodes: No discrete thyroid nodules. Unremarkable esophagus. No pathologically enlarged axillary, mediastinal or hilar lymph nodes. Lungs/Pleura: No pneumothorax. No pleural effusion. No acute consolidative airspace disease, lung masses or significant pulmonary nodules. Upper abdomen: No acute abnormality. Musculoskeletal: No aggressive appearing focal osseous lesions. Partially visualized bilateral shoulder arthroplasty. Marked thoracic spondylosis. Review of the MIP images confirms the above findings. IMPRESSION: 1. No pulmonary embolism. No active pulmonary disease. 2. One vessel coronary atherosclerosis. Electronically Signed   By: JIlona SorrelM.D.   On: 06/18/2022 08:10   DG Chest Port 1 View  Result Date: 06/17/2022 CLINICAL DATA:  Shortness of breath EXAM: PORTABLE CHEST 1 VIEW COMPARISON:  01/05/2008 FINDINGS: The heart size and mediastinal contours are within normal limits. Both lungs are clear.  Bilateral shoulder replacements. Aortic atherosclerosis. IMPRESSION: No active disease. Electronically Signed   By: KDonavan FoilM.D.   On: 06/17/2022 22:23     Assessment and Plan:   1. New onset atrial fibrillation - spontaneously converted overnight on IV diltiazem, started on oral metoprolol this AM - UDS not completed- cannot completely exclude use of illicit substances so would prefer to send out on CCB rather than beta blocker (he is not willing to stay any longer in ER) - recommend IM send home with diltiazem CD 1219mdaily - OK to continue Eliquis at discharge but will need close follow-up to continue to evaluate candidacy (risks, benefits discussed with patient and he is agreeable)  2. ? Substance intoxication - see initial triage note regarding THC gummies and taking ? 35 pills of unknown strength - patient denies this to me, stating he took excessive amount of Ambien 3-4 weeks ago - affect impatient, eager to leave the emergency room - recommend close follow-up with primary care upon discharge  3. Essential HTN - hold amlodipine since starting diltiazem and continue losartan (was not adherent with these recently PTA)  4. Coronary, aortic calcification incidentally noted on CT - continue risk factor modification as outpatient, can review statin therapy follow-up as outpatient  - hsTroponin low and flat, suspect demand ischemia, no recent angina, symptoms resolved with resolution of arrhythmia - no ASA given initiation of Eliquis  5. Hypokalemia/hypomagnesemia, lactic acidosis - electrolyte repletion and management per IM  6. Mild mitral stenosis by echo - not clinically significant at this time; per  d/w Dr. Marlou Porch - OK to use Eliquis since this is only mild in nature - will need to continue to follow as outpatient  Have arranged f/u with Afib clinic 07/02/22 and appt info listed on AVS. Cardiology otherwise will s/o. Relayed recommendations to IM and nursing team in secure  chat so that they can facilitate his discharge paperwork ASAP as he has given the team until 12 noon to finish. Please call with questions.   Risk Assessment/Risk Scores:          CHA2DS2-VASc Score = 4   This indicates a 4.8% annual risk of stroke. The patient's score is based upon: CHF History: 0 HTN History: 1 Diabetes History: 0 Stroke History: 0 Vascular Disease History: 1 (aortic atherosclerosis, coronary atherosclerosis) Age Score: 2 Gender Score: 0         For questions or updates, please contact [facility] Please consult [email or web address] for contact info under    Signed, Charlie Pitter, PA-C  06/18/2022 56:28 AM  81 year old male with paroxysmal atrial fibrillation.  Currently in sinus rhythm.  Has anxiety.  Coronary calcification.  Requesting to leave now.  His sister is now in the room.  She is a patient of Dr. Olin Pia.  We discussed his current case.  He does state that he takes atorvastatin only.  Unsure of compliance.  Given his atrial fibrillation and potential risk of stroke, he was started on Eliquis 5 mg twice a day.  I agree with this.  Hopefully compliance will not be an issue.  We also suggest p.o. diltiazem CD 120 mg once a day.  Potassium and magnesium have been repleted per primary team  Since he is adamant upon leaving, I do not think it is unreasonable at this point.  We will have follow-up in atrial fibrillation clinic to show that he is maintaining sinus rhythm.  Candee Furbish, MD

## 2022-06-18 NOTE — Assessment & Plan Note (Signed)
.   Resume patients home regimen of oral antihypertensives . Titrate antihypertensive regimen as necessary to achieve adequate BP control . PRN intravenous antihypertensives for excessively elevated blood pressure   

## 2022-06-18 NOTE — ED Notes (Signed)
Called to room by pt and was advised that he wanted me to start shutting things down that he wanted to leave. Spoke with Dr. Leafy Half and made him aware and he is now at the bedside

## 2022-06-18 NOTE — ED Notes (Signed)
Pt ambulated to restroom with steady gait and without assistance at this time to have BM

## 2022-06-18 NOTE — Assessment & Plan Note (Signed)
.   Continuing home regimen of lipid lowering therapy.  

## 2022-06-18 NOTE — ED Notes (Signed)
Dilt drip decreased due to current bp level

## 2022-06-18 NOTE — Discharge Summary (Signed)
Return normal sinus rhythm.Physician Discharge Summary  SEYMOUR PAVLAK FUX:[id number] DOB: 1941-08-22 DOA: 06/17/2022  PCP: Felix Pacini, FNP  Admit date: 06/17/2022 Discharge date: 06/18/2022  Admitted From: Home Disposition:  Home  Discharge Condition:Stable CODE STATUS:FULL Diet recommendation: Heart Healthy   Brief/Interim Summary: 81 year old male with past medical history of hyperlipidemia, hypertension, macular degeneration, benign prostatic hyperplasia who presents to [facility] emergency department via EMS with complaints of shortness of breath.  On presentation he was found to be in A-fib with RVR.  Started on Cardizem drip and he converted to normal sinus rhythm.  Cardiology also following here.  He remains hemodynamically stable, cardiology cleared for discharge and he will follow-up with A-fib clinic as an outpatient.  Following problems were addressed during his hospitalization:   Atrial fibrillation with rapid ventricular response [facility]) Patient presenting with atrial fibrillation with rapid ventricular response Patient initiated on intravenous cardizem by the emergency department staff , will continue for now.  CHA2DS2-VASc score noted to be: 3 Indicating that full dose anticoagulation is warranted.  Initiating Eliquis Echo showed preserved ejection fraction, mild mitral stenosis but not clinically significant Started on Cardizem for rate control. He will follow-up with A-fib clinic as an outpatient. Cardiology cleared for discharge.     Drug overdose Patient reports taking large amounts of Ambien and THC Gummies over the past several days to nursing staff but denies this when I personally question him No clinical evidence of benzodiazepine or THC overdose on my exam Counseled for limiting this medication    Elevated troponin level not due myocardial infarction Slightly elevated troponin No evidence of dynamic ST segment change Elevated troponin  likely secondary to supply/demand mismatch in the setting of rapid atrial fibrillation No chest pain   Leukocytosis No evidence of infection on exam CXR/CT without  evidence of pneumonia.     Hypokalemia Supplemented   Hypomagnesemia Supplemented   Essential hypertension Amlodipine discontinued.  Continue Cardizem, losartan   BPH (benign prostatic hyperplasia) Continue home regimen of Flomax   Mixed hyperlipidemia Continuing home regimen of lipid lowering therapy.     Discharge Diagnoses:  Principal Problem:   Atrial fibrillation with rapid ventricular response (HCC) Active Problems:   Elevated troponin level not due myocardial infarction   Accidental zolpidem overdose   Leukocytosis   Hypokalemia   Hypomagnesemia   Essential hypertension   BPH (benign prostatic hyperplasia)   Mixed hyperlipidemia    Discharge Instructions  Discharge Instructions     Amb referral to AFIB Clinic   Complete by: As directed    Diet - low sodium heart healthy   Complete by: As directed    Discharge instructions   Complete by: As directed    1)Please take prescribed medication as instructed 2)Follow up with your PCP in a week 3)Follow up at A-fib clinic on 8/1.  Name and number the provider group has been attached   Increase activity slowly   Complete by: As directed       Allergies as of 06/18/2022   No Known Allergies      Medication List     STOP taking these medications    amLODipine 10 MG tablet Commonly known as: NORVASC       TAKE these medications    apixaban 5 MG Tabs tablet Commonly known as: ELIQUIS Take 1 tablet (5 mg total) by mouth 2 (two) times daily.   atorvastatin 10 MG tablet Commonly known as: LIPITOR Take 10 mg by mouth daily.  diltiazem 120 MG 24 hr capsule Commonly known as: CARDIZEM CD Take 1 capsule (120 mg total) by mouth daily.   losartan 100 MG tablet Commonly known as: COZAAR Take 100 mg by mouth daily.   tamsulosin 0.4  MG Caps capsule Commonly known as: FLOMAX Take 0.4 mg by mouth daily.   zolpidem 5 MG tablet Commonly known as: AMBIEN Take 2 tablets (10 mg total) by mouth at bedtime as needed for sleep. What changed:  medication strength when to take this reasons to take this        Follow-up Information     [place] Follow up.   Specialty: Cardiology Why: A follow-up appointment has been scheduled for you at the [facility] and [facility] on Tuesday August 1 at 10:30 AM. Arrive 15 minutes early to check in. See Discharge Instructions section for more information. Contact information: [address] [id number] mc [address] [place]        Beverley Fiedler, [place]. Schedule an appointment as soon as possible for a visit in 1 week(s).   Specialty: Endocrinology Contact information: [address] [place] [address] [phone]                No Known Allergies  Consultations: Cardiology   Procedures/Studies: ECHOCARDIOGRAM COMPLETE  Result Date: 06/18/2022    ECHOCARDIOGRAM REPORT   Patient Name:   Marke Alphia Kava Date of Exam: 06/18/2022 Medical Rec #:  [id number]      Height:       68.0 in Accession #:    [id number]     Weight:       170.0 lb Date of Birth:  March 18, 1941      BSA:          1.907 m Patient Age:    72 years       BP:           98/72 mmHg Patient Gender: M              HR:           63 bpm. Exam Location:  Inpatient Procedure: 2D Echo, Cardiac Doppler and Color Doppler Indications:    A fib  History:        Patient has no prior history of Echocardiogram examinations.                 Risk Factors:Hypertension.  Sonographer:    Melissa Morford RDCS (AE, PE) Referring Phys: [phone] Sherryll Burger [facility]  Sonographer Comments: Technically challenging study due to limited acoustic windows, no subcostal window and suboptimal apical window. IMPRESSIONS  1. Left ventricular  ejection fraction, by estimation, is 55 to 60%. The left ventricle has normal function. Left ventricular endocardial border not optimally defined to evaluate regional wall motion. Left ventricular diastolic function could not be evaluated.  2. Right ventricular systolic function is mildly reduced. The right ventricular size is mildly enlarged.  3. Left atrial size was mild to moderately dilated.  4. The mitral valve is degenerative. Trivial mitral valve regurgitation. Mild mitral stenosis. Moderate to severe mitral annular calcification.  5. The aortic valve is tricuspid. There is moderate calcification of the aortic valve. There is moderate thickening of the aortic valve. Aortic valve regurgitation is not visualized. Aortic valve sclerosis/calcification is present, without any evidence of aortic stenosis. FINDINGS  Left Ventricle: Left ventricular ejection fraction, by estimation, is 55 to 60%. The left ventricle has normal  function. Left ventricular endocardial border not optimally defined to evaluate regional wall motion. The left ventricular internal cavity size was normal in size. There is no left ventricular hypertrophy. Left ventricular diastolic function could not be evaluated. Right Ventricle: The right ventricular size is mildly enlarged. No increase in right ventricular wall thickness. Right ventricular systolic function is mildly reduced. Left Atrium: Left atrial size was mild to moderately dilated. Right Atrium: Right atrial size was normal in size. Pericardium: There is no evidence of pericardial effusion. Mitral Valve: The mitral valve is degenerative in appearance. Moderate to severe mitral annular calcification. Trivial mitral valve regurgitation. Mild mitral valve stenosis. MV peak gradient, 7.7 mmHg. The mean mitral valve gradient is 3.7 mmHg. Tricuspid Valve: The tricuspid valve is grossly normal. Tricuspid valve regurgitation is trivial. No evidence of tricuspid stenosis. Aortic Valve: The  aortic valve is tricuspid. There is moderate calcification of the aortic valve. There is moderate thickening of the aortic valve. Aortic valve regurgitation is not visualized. Aortic valve sclerosis/calcification is present, without any  evidence of aortic stenosis. Aortic valve mean gradient measures 7.0 mmHg. Aortic valve peak gradient measures 11.0 mmHg. Aortic valve area, by VTI measures 1.39 cm. Pulmonic Valve: The pulmonic valve was grossly normal. Pulmonic valve regurgitation is trivial. No evidence of pulmonic stenosis. Aorta: The aortic root is normal in size and structure. Venous: The inferior vena cava was not well visualized. IAS/Shunts: The atrial septum is grossly normal.  LEFT VENTRICLE PLAX 2D LVIDd:         3.95 cm LVIDs:         3.25 cm LV PW:         1.00 cm LV IVS:        1.15 cm LVOT diam:     2.10 cm LV SV:         41 LV SV Index:   22 LVOT Area:     3.46 cm  RIGHT VENTRICLE RV S prime:     10.70 cm/s TAPSE (M-mode): 2.2 cm LEFT ATRIUM              Index        RIGHT ATRIUM           Index LA diam:        4.30 cm  2.25 cm/m   RA Area:     16.80 cm LA Vol (A2C):   108.0 ml 56.62 ml/m  RA Volume:   43.70 ml  22.91 ml/m LA Vol (A4C):   79.9 ml  41.89 ml/m LA Biplane Vol: 98.3 ml  51.54 ml/m  AORTIC VALVE AV Area (Vmax):    1.46 cm AV Area (Vmean):   1.18 cm AV Area (VTI):     1.39 cm AV Vmax:           166.00 cm/s AV Vmean:          124.000 cm/s AV VTI:            0.297 m AV Peak Grad:      11.0 mmHg AV Mean Grad:      7.0 mmHg LVOT Vmax:         69.90 cm/s LVOT Vmean:        42.400 cm/s LVOT VTI:          0.119 m LVOT/AV VTI ratio: 0.40  AORTA Ao Root diam: 3.10 cm MITRAL VALVE                TRICUSPID VALVE  MV Area (PHT): 2.76 cm     TR Peak grad:   17.8 mmHg MV Area VTI:   0.83 cm     TR Vmax:        211.00 cm/s MV Peak grad:  7.7 mmHg MV Mean grad:  3.7 mmHg     SHUNTS MV Vmax:       1.38 m/s     Systemic VTI:  0.12 m MV Vmean:      91.5 cm/s    Systemic Diam: 2.10 cm MV Decel  Time: 275 msec MV E velocity: 118.00 cm/s MV A velocity: 158.00 cm/s MV E/A ratio:  0.75 Eleonore Chiquito MD Electronically signed by Eleonore Chiquito MD Signature Date/Time: 06/18/2022/10:03:30 AM    Final    CT Angio Chest Pulmonary Embolism (PE) W or WO Contrast  Result Date: 06/18/2022 CLINICAL DATA:  Pulmonary embolism (PE) suspected, positive D-dimer. Dyspnea. EXAM: CT ANGIOGRAPHY CHEST WITH CONTRAST TECHNIQUE: Multidetector CT imaging of the chest was performed using the standard protocol during bolus administration of intravenous contrast. Multiplanar CT image reconstructions and MIPs were obtained to evaluate the vascular anatomy. RADIATION DOSE REDUCTION: This exam was performed according to the departmental dose-optimization program which includes automated exposure control, adjustment of the mA and/or kV according to patient size and/or use of iterative reconstruction technique. CONTRAST:  72mL OMNIPAQUE IOHEXOL 350 MG/ML SOLN COMPARISON:  Chest radiograph from one day prior. 01/08/2008 chest CT angiogram. FINDINGS: Cardiovascular: The study is high quality for the evaluation of pulmonary embolism. There are no filling defects in the central, lobar, segmental or subsegmental pulmonary artery branches to suggest acute pulmonary embolism. Great vessels are normal in course and caliber. Top-normal heart size. No significant pericardial fluid/thickening. Left anterior descending coronary atherosclerosis. Mediastinum/Nodes: No discrete thyroid nodules. Unremarkable esophagus. No pathologically enlarged axillary, mediastinal or hilar lymph nodes. Lungs/Pleura: No pneumothorax. No pleural effusion. No acute consolidative airspace disease, lung masses or significant pulmonary nodules. Upper abdomen: No acute abnormality. Musculoskeletal: No aggressive appearing focal osseous lesions. Partially visualized bilateral shoulder arthroplasty. Marked thoracic spondylosis. Review of the MIP images confirms the above  findings. IMPRESSION: 1. No pulmonary embolism. No active pulmonary disease. 2. One vessel coronary atherosclerosis. Electronically Signed   By: Ilona Sorrel M.D.   On: 06/18/2022 08:10   DG Chest Port 1 View  Result Date: 06/17/2022 CLINICAL DATA:  Shortness of breath EXAM: PORTABLE CHEST 1 VIEW COMPARISON:  01/05/2008 FINDINGS: The heart size and mediastinal contours are within normal limits. Both lungs are clear. Bilateral shoulder replacements. Aortic atherosclerosis. IMPRESSION: No active disease. Electronically Signed   By: Donavan Foil M.D.   On: 06/17/2022 22:23      Subjective: Patient seen and examined at the bedside this morning.  Hemodynamically stable.  On normal sinus rhythm.  Denies any current complaints.  Completely alert and oriented.  Very adamant about leaving  Discharge Exam: Vitals:   06/18/22 0815 06/18/22 0945  BP: 126/78 (!) 150/88  Pulse: (!) 54 74  Resp: 15 (!) 23  Temp:    SpO2: 98% 100%   Vitals:   06/18/22 0654 06/18/22 0656 06/18/22 0815 06/18/22 0945  BP:  (!) 162/88 126/78 (!) 150/88  Pulse:  93 (!) 54 74  Resp:   15 (!) 23  Temp: 98.7 F (37.1 C)     TempSrc: Oral     SpO2:   98% 100%  Weight:      Height:        General: Pt is alert,  awake, not in acute distress Cardiovascular: RRR, S1/S2 +, no rubs, no gallops Respiratory: CTA bilaterally, no wheezing, no rhonchi Abdominal: Soft, NT, ND, bowel sounds + Extremities: no edema, no cyanosis    The results of significant diagnostics from this hospitalization (including imaging, microbiology, ancillary and laboratory) are listed below for reference.     Microbiology: No results found for this or any previous visit (from the past 240 hour(s)).   Labs: BNP (last 3 results) No results for input(s): "BNP" in the last 8760 hours. Basic Metabolic Panel: Recent Labs  Lab 06/17/22 2215 06/18/22 0326 06/18/22 0337  NA 138 136 136  K 2.7* 3.5 3.7  CL 112* 107  --   CO2 15* 17*  --    GLUCOSE 101* 109*  --   BUN 15 15  --   CREATININE 1.06 1.14  --   CALCIUM 8.6* 9.1  --   MG 1.5* 1.7  --    Liver Function Tests: Recent Labs  Lab 06/17/22 2215 06/18/22 0326  AST 19 18  ALT 9 11  ALKPHOS 59 57  BILITOT 0.6 0.6  PROT 5.5* 5.4*  ALBUMIN 2.9* 3.0*   No results for input(s): "LIPASE", "AMYLASE" in the last 168 hours. No results for input(s): "AMMONIA" in the last 168 hours. CBC: Recent Labs  Lab 06/17/22 2215 06/18/22 0326 06/18/22 0337  WBC 12.4* 12.8*  --   NEUTROABS 7.9* 8.0*  --   HGB 12.8* 13.0 12.9*  HCT 37.7* 37.6* 38.0*  MCV 90.2 89.3  --   PLT 290 284  --    Cardiac Enzymes: No results for input(s): "CKTOTAL", "CKMB", "CKMBINDEX", "TROPONINI" in the last 168 hours. BNP: Invalid input(s): "POCBNP" CBG: No results for input(s): "GLUCAP" in the last 168 hours. D-Dimer Recent Labs    06/18/22 0326  DDIMER 0.58*   Hgb A1c No results for input(s): "HGBA1C" in the last 72 hours. Lipid Profile No results for input(s): "CHOL", "HDL", "LDLCALC", "TRIG", "CHOLHDL", "LDLDIRECT" in the last 72 hours. Thyroid function studies Recent Labs    06/18/22 0140  TSH 1.010   Anemia work up No results for input(s): "VITAMINB12", "FOLATE", "FERRITIN", "TIBC", "IRON", "RETICCTPCT" in the last 72 hours. Urinalysis    Component Value Date/Time   COLORURINE YELLOW 01/06/2008 1614   APPEARANCEUR CLEAR 01/06/2008 1614   LABSPEC 1.011 01/06/2008 1614   PHURINE 5.5 01/06/2008 1614   GLUCOSEU NEGATIVE 01/06/2008 1614   HGBUR SMALL (A) 01/06/2008 1614   BILIRUBINUR NEGATIVE 01/06/2008 1614   KETONESUR NEGATIVE 01/06/2008 1614   PROTEINUR NEGATIVE 01/06/2008 1614   UROBILINOGEN 0.2 01/06/2008 1614   NITRITE NEGATIVE 01/06/2008 1614   LEUKOCYTESUR NEGATIVE 01/06/2008 1614   Sepsis Labs Recent Labs  Lab 06/17/22 2215 06/18/22 0326  WBC 12.4* 12.8*   Microbiology No results found for this or any previous visit (from the past 240 hour(s)).  Please  note: You were cared for by a hospitalist during your hospital stay. Once you are discharged, your primary care physician will handle any further medical issues. Please note that NO REFILLS for any discharge medications will be authorized once you are discharged, as it is imperative that you return to your primary care physician (or establish a relationship with a primary care physician if you do not have one) for your post hospital discharge needs so that they can reassess your need for medications and monitor your lab values.    Time coordinating discharge: 40 minutes  SIGNED:   Burnadette Pop, MD  [facility] 06/18/2022, 11:59 AM Pager [phone]  If 7PM-7AM, please contact night-coverage [email or web address] Password [email or web address]

## 2022-06-18 NOTE — Assessment & Plan Note (Signed)
   Continue home regimen of Flomax 

## 2022-06-18 NOTE — Discharge Instructions (Addendum)
You have an appointment set up with the Atrial Fibrillation Clinic.  Multiple studies have shown that being followed by a dedicated atrial fibrillation clinic in addition to the standard care you receive from your other physicians improves health. We believe that enrollment in the atrial fibrillation clinic will allow [place] to better care for you.  The phone number to the Atrial Fibrillation Clinic is [phone]. The clinic is staffed Monday through Friday from 8:30am to 5pm.  Parking Directions: The clinic is located in the Heart and Vascular Building connected to [facility]. There is free valet parking available or you may park in the parking garage. 1)From [address] turn on to [address] and go to the 3rd entrance  (Heart and Vascular entrance) on the right. 2)Look to the right for Heart &Vascular Parking Garage. 3)A code for the entrance is required if you park there: 1403 for the month of August  4)Take the elevators to the 1st floor. Registration is in the room with the glass walls at the end of the hallway.  If you have any trouble parking or locating the clinic, please don't hesitate to call [phone].    Information on my medicine - ELIQUIS (apixaban)  This medication education was reviewed with me or my healthcare representative as part of my discharge preparation.    Why was Eliquis prescribed for you? Eliquis was prescribed for you to reduce the risk of a blood clot forming that can cause a stroke if you have a medical condition called atrial fibrillation (a type of irregular heartbeat).  What do You need to know about Eliquis ? Take your Eliquis TWICE DAILY - one tablet in the morning and one tablet in the evening with or without food. If you have difficulty swallowing the tablet whole please discuss with your pharmacist how to take the medication safely.  Take Eliquis exactly as prescribed by your doctor and DO NOT stop taking Eliquis without talking to  the doctor who prescribed the medication.  Stopping may increase your risk of developing a stroke.  Refill your prescription before you run out.  After discharge, you should have regular check-up appointments with your healthcare provider that is prescribing your Eliquis.  In the future your dose may need to be changed if your kidney function or weight changes by a significant amount or as you get older.  What do you do if you miss a dose? If you miss a dose, take it as soon as you remember on the same day and resume taking twice daily.  Do not take more than one dose of ELIQUIS at the same time to make up a missed dose.  Important Safety Information A possible side effect of Eliquis is bleeding. You should call your healthcare provider right away if you experience any of the following: Bleeding from an injury or your nose that does not stop. Unusual colored urine (red or dark brown) or unusual colored stools (red or black). Unusual bruising for unknown reasons. A serious fall or if you hit your head (even if there is no bleeding).  Some medicines may interact with Eliquis and might increase your risk of bleeding or clotting while on Eliquis. To help avoid this, consult your healthcare provider or pharmacist prior to using any new prescription or non-prescription medications, including herbals, vitamins, non-steroidal anti-inflammatory drugs (NSAIDs) and supplements.  This website has more information on Eliquis (apixaban): http://www.eliquis.com/eliquis/home

## 2022-06-18 NOTE — ED Notes (Signed)
Spoke with admit provider will give melatonin to help pt sleep but that is it

## 2022-06-18 NOTE — Assessment & Plan Note (Addendum)
   Patient reports taking large amounts of Ambien " a week or two ago."  Told the nurse that he took 35 tablets but to me he states that he actually does not know how many he took "I just took a lot."    No clinical evidence of benzodiazepine overdose on my exam/evaluation  Likely the effects of zolpidem overuse have resolved at this point  Denies suicidal or homicidal ideation  Counseled on appropriate use of prescribed medications going forward

## 2022-06-18 NOTE — ED Notes (Signed)
Patient was given a cup of ice water. 

## 2022-06-18 NOTE — Assessment & Plan Note (Signed)
   No evidence of infection on exam  Obtaining chest x-ray, urinalysis

## 2022-06-18 NOTE — ED Notes (Signed)
Breakfast order placed ?

## 2022-06-18 NOTE — ED Notes (Signed)
Pt removed from oxygen at this time. Admit provider remains at bedside

## 2022-06-18 NOTE — Assessment & Plan Note (Signed)
   Replacing with intravenous magnesium sulfate

## 2022-06-18 NOTE — Assessment & Plan Note (Signed)
·   Replacing with potassium chloride °· Evaluating for concurrent hypomagnesemia  °· Monitoring potassium levels with serial chemistries. ° °

## 2022-06-18 NOTE — TOC Benefit Eligibility Note (Signed)
Patient Advocate Encounter  Insurance verification completed.    The patient is currently admitted and upon discharge could be taking Eliquis 5 mg.  The current 30 day co-pay is, $45.00.   The patient is insured through [employer] Part D     Mitchell Harris, CPhT Pharmacy Patient Advocate Specialist [facility] Patient Advocate Team Direct Number: [phone]  Fax: [phone]        

## 2022-06-18 NOTE — H&P (Signed)
History and Physical    Patient: Mitchell Harris MRN: [id number] DOA: 06/17/2022  Date of Service: the patient was seen and examined on 06/18/2022  Patient coming from: Home  Chief Complaint:  Chief Complaint  Patient presents with   Shortness of Breath    HPI:   81 year old male with past medical history of hyperlipidemia, hypertension, macular degeneration, benign prostatic hyperplasia who presents to [facility] emergency department via EMS with complaints of shortness of breath.  Patient explains that for the past 24 hours he has been experiencing shortness of breath.  Shortness of breath has been worse with exertion and improved with rest.  Patient denies associated cough, fever, sick contacts, recent travel or contact with COVID-19 infection.  She has been experiencing intermittent atypical left-sided chest pain, primarily in the left anterior chest wall.  This does not seem to have any association with exertion.  Upon further questioning patient states that he consumed a large amount of Ambien tablets in the days prior to arrival  Due to patient's persisting symptoms patient initially presented to [facility] emergency department for evaluation.  Upon evaluation in the emergency department patient was found to be in rapid atrial fibrillation with heart rates in excess of 140 bpm.  Patient was initiated on a diltiazem infusion as well as a 1 L bolus of lactated Ringer solution.  Patient was found to have substantial hypokalemia of 2.7 and was initiated on potassium an 30 mill equivalents of intravenous potassium chloride in addition to 40 equivalents of oral potassium chloride were ordered.  Due to ongoing complaints of shortness of breath and rapid atrial fibrillation    Review of Systems: Review of Systems  Respiratory:  Positive for shortness of breath.   All other systems reviewed and are negative.    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 11/01/2014   Macular degeneration    left eye, rec'ing injecctions    Neuromuscular disorder ([place])    cervical degeneration   PONV (postoperative nausea and vomiting)    Primary localized osteoarthrosis of right shoulder 11/03/2015    Past Surgical History:  Procedure Laterality Date   CERVICAL FUSION  2008   CHOLECYSTECTOMY     JOINT REPLACEMENT  2000&2007   both knees    KNEE SURGERY     arthroscopic- both knees , L knee reconstruction prior to replacement    SHOULDER ARTHROSCOPY     both shoulders    TONSILLECTOMY     TOTAL SHOULDER ARTHROPLASTY Left 11/01/2014   Procedure: TOTAL SHOULDER ARTHROPLASTY;  Surgeon: Johnny Bridge, MD;  Location: [place];  Service: Orthopedics;  Laterality: Left;   TOTAL SHOULDER ARTHROPLASTY Right 11/03/2015   Procedure: RIGHT TOTAL SHOULDER ARTHROPLASTY;  Surgeon: Marchia Bond, MD;  Location: [place];  Service: Orthopedics;  Laterality: Right;  ANESTHESIA:  GENERAL, PRE/POST OP SCALENE    Social History:  reports that he has never smoked. He has never used smokeless tobacco. He reports that he does not drink alcohol and does not use drugs.  No Known Allergies  Family History  Problem Relation Age of Onset   Arthritis Mother  Pulmonary embolism Mother    Stroke Father    Heart attack Brother    Other Brother    Heart attack Brother     Prior to Admission medications   Medication Sig Start Date End Date Taking? Authorizing Provider  amLODipine (NORVASC) 10 MG tablet TAKE 1 TABLET BY MOUTH DAILY 06/30/19   Toniann Fail, NP  atorvastatin (LIPITOR) 10 MG tablet Take 10 mg by mouth daily at 6 PM.     [provider]  celecoxib (CELEBREX) 100 MG capsule  Take 100 mg by mouth daily.    [provider]  HYDROmorphone (DILAUDID) 2 MG tablet Take 1 tablet (2 mg total) by mouth every 4 (four) hours as needed for severe pain. Patient not taking: Reported on 04/07/2019 11/03/15   Teryl Lucy, MD  losartan (COZAAR) 100 MG tablet Take 100 mg by mouth daily after supper.     [provider]  meloxicam (MOBIC) 15 MG tablet Take 1 tablet (15 mg total) by mouth daily. Patient not taking: Reported on 04/07/2019 03/28/17   Asencion Islam, DPM  methocarbamol (ROBAXIN) 750 MG tablet Take 750 mg by mouth 4 (four) times daily.    [provider]  ondansetron (ZOFRAN) 4 MG tablet Take 1 tablet (4 mg total) by mouth every 8 (eight) hours as needed for nausea or vomiting. Patient not taking: Reported on 04/07/2019 11/03/15   Teryl Lucy, MD  oxyCODONE-acetaminophen (PERCOCET) 10-325 MG tablet Take 1-2 tablets by mouth every 6 (six) hours as needed for pain. MAXIMUM TOTAL ACETAMINOPHEN DOSE IS 4000 MG PER DAY 11/03/15   Teryl Lucy, MD  sennosides-docusate sodium (SENOKOT-S) 8.6-50 MG tablet Take 2 tablets by mouth daily. Patient not taking: Reported on 04/07/2019 11/03/15   Teryl Lucy, MD  tamsulosin (FLOMAX) 0.4 MG CAPS capsule Take 0.4 mg by mouth daily after supper.     [provider]  zolpidem (AMBIEN) 10 MG tablet Take 10 mg by mouth at bedtime.    [provider]    Physical Exam:  Vitals:   06/18/22 0115 06/18/22 0116 06/18/22 0118 06/18/22 0130  BP: (!) 143/80   98/72  Pulse: 91   92  Resp: 14   11  Temp:   98.2 F (36.8 C)   TempSrc:   Oral   SpO2: 100% 100%  99%  Weight:      Height:        Constitutional: Awake alert and oriented x3, no associated distress.   Skin: no rashes, no lesions, good skin turgor noted. Eyes: Pupils are equally reactive to light.  No evidence of scleral icterus or conjunctival pallor.  ENMT: Moist mucous membranes noted.  Posterior pharynx clear of any exudate or lesions.    Neck: normal, supple, no masses, no thyromegaly.  No evidence of jugular venous distension.   Respiratory: clear to auscultation bilaterally, no wheezing, no crackles. Normal respiratory effort. No accessory muscle use.  Cardiovascular: Regular rate and rhythm, no murmurs / rubs / gallops. No extremity edema. 2+ pedal pulses. No carotid bruits.  Chest:   Nontender without crepitus or deformity.   Back:   Nontender without crepitus or deformity. Abdomen: Abdomen is soft and nontender.  No evidence of intra-abdominal masses.  Positive bowel sounds noted in all quadrants.   Musculoskeletal: No joint deformity upper and lower extremities. Good ROM, no contractures. Normal muscle tone.  Neurologic: CN 2-12 grossly intact. Sensation intact.  Patient moving all 4 extremities spontaneously.  Patient is following all commands.  Patient is responsive to verbal stimuli.   Psychiatric: Patient is visibly anxious with appropriate affect.  Patient seems to possess insight as to their current situation.    Data Reviewed:  I have personally reviewed and interpreted labs, imaging.  Significant findings are:  CBC revealing white blood cell count of 12.4, hemoglobin 12.8, hematocrit 37.7, platelet count 290 Chemistry revealing sodiu Ethanol level less than 10 m 138, potassium 2.7, chloride 112, bicarbonate 15, BUN 15, Cr 1.06 Troponin 34 TSH 1.062 CXR:  Chest X-ray was personally reviewed.  No evidence of focal infiltrates.  No evidence of pleural effusion.  No evidence of pneumothorax.     EKG: Personally reviewed.  Rhythm is atrial fibrillation with heart rate of 135 bpm.  No dynamic ST segment changes appreciated.   Assessment and Plan: * Atrial fibrillation with rapid ventricular response [facility]) Patient presenting with atrial fibrillation with rapid ventricular response Patient initiated on intravenous cardizem by the emergency department staff , will continue for now.  CHA2DS2-VASc score noted to  be: 3 Indicating that full dose anticoagulation is warranted.  Initiating Eliquis Obtaining echocardiogram in the morning Monitoring patient on telemetry Obtaining cardiology consultation the morning Furthermore, obtaining magnesium, TSH, urine drug screen, troponin and D-dimer to evaluate for possible etiologies    Drug overdose Patient reports taking large amounts of Ambien and THC Gummies over the past several days to nursing staff but denies this when I personally question him No clinical evidence of benzodiazepine or THC overdose on my exam Supportive care for now Monitoring on telemetry Monitoring electrolytes with serial chemistries Gentle intravenous resuscitation  Elevated troponin level not due myocardial infarction Slightly elevated troponin No evidence of dynamic ST segment change Elevated troponin likely secondary to supply/demand mismatch in the setting of rapid atrial fibrillation Monitoring patient on telemetry Cycling cardiac enzymes  Leukocytosis No evidence of infection on exam Obtaining chest x-ray, urinalysis  Hypokalemia Replacing with potassium chloride Evaluating for concurrent hypomagnesemia  Monitoring potassium levels with serial chemistries.   Hypomagnesemia Replacing with intravenous magnesium sulfate  Essential hypertension Resume patients home regimen of oral antihypertensives Titrate antihypertensive regimen as necessary to achieve adequate BP control PRN intravenous antihypertensives for excessively elevated blood pressure    BPH (benign prostatic hyperplasia) Continue home regimen of Flomax  Mixed hyperlipidemia Continuing home regimen of lipid lowering therapy.        Code Status:  Full code  code status decision has been confirmed with: patient Family Communication: deferred   Consults: Inbox request for consult sent to Cardiology  Severity of Illness:  The appropriate patient status for this patient is OBSERVATION.  Observation status is judged to be reasonable and necessary in order to provide the required intensity of service to ensure the patient's safety. The patient's presenting symptoms, physical exam findings, and initial radiographic and laboratory data in the context of their medical condition is felt to place them at decreased risk for further clinical deterioration. Furthermore, it is anticipated that the patient will be medically stable for discharge from the hospital within 2 midnights of admission.   Author:  Marinda Elk MD  06/18/2022 2:36 AM

## 2022-07-02 ENCOUNTER — Ambulatory Visit (HOSPITAL_COMMUNITY): Payer: Medicare HMO | Admitting: Physician Assistant
